# Patient Record
Sex: Male | Born: 1990 | Race: White | Hispanic: No | Marital: Single | Smoking: Never smoker
Health system: Southern US, Community
[De-identification: ages and names within clinical notes are randomized; demographics above are authoritative.]

## PROBLEM LIST (undated history)

## (undated) DIAGNOSIS — G809 Cerebral palsy, unspecified: Secondary | ICD-10-CM

## (undated) DIAGNOSIS — K219 Gastro-esophageal reflux disease without esophagitis: Secondary | ICD-10-CM

## (undated) DIAGNOSIS — R569 Unspecified convulsions: Secondary | ICD-10-CM

## (undated) DIAGNOSIS — H539 Unspecified visual disturbance: Secondary | ICD-10-CM

## (undated) DIAGNOSIS — F79 Unspecified intellectual disabilities: Secondary | ICD-10-CM

## (undated) HISTORY — PX: NISSEN FUNDOPLICATION: SHX2091

## (undated) HISTORY — DX: Unspecified convulsions: R56.9

## (undated) HISTORY — PX: HERNIA REPAIR: SHX51

## (undated) HISTORY — DX: Unspecified visual disturbance: H53.9

## (undated) HISTORY — PX: CIRCUMCISION: SUR203

---
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
## 2008-01-17 IMAGING — CT CT ABDOMEN W/O CM
1 of 2 series · 14 of 32 positions shown, 19 images · IV contrast (agent unspecified)
Comparison: None. 
CT ABDOMEN WITHOUT CONTRAST:

CLINICAL DATA: 16 year old assess peg tube abscess, drainage.  
CT ABDOMEN AND PELVIS WITHOUT CONTRAST:
TECHNIQUE: Multidetector CT imaging of the abdomen and pelvis was performed following the standard protocol without IV contrast.

[Series 2: abdomen w/ · axial · 0.70mm/px · z∈[-330,+75]mm · 14 of 93 slices shown, 19 images]
[im 6/93  soft-tissue]
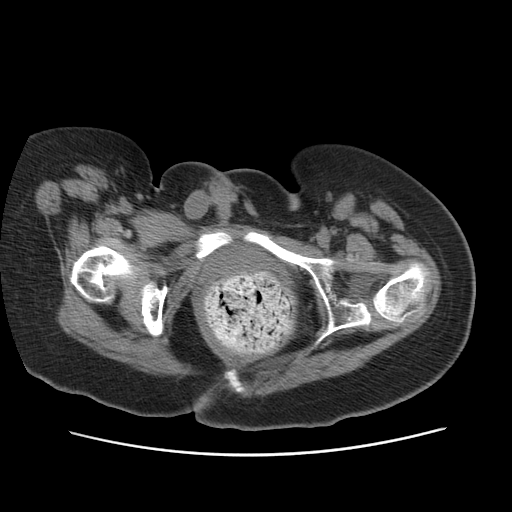
[im 6/93  bone]
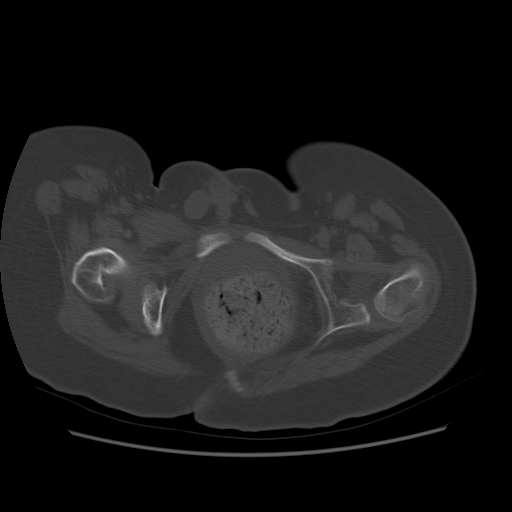
[im 11/93  soft-tissue]
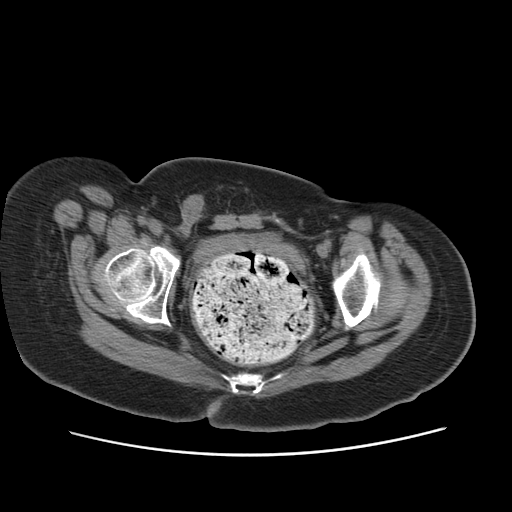
[im 22/93  soft-tissue]
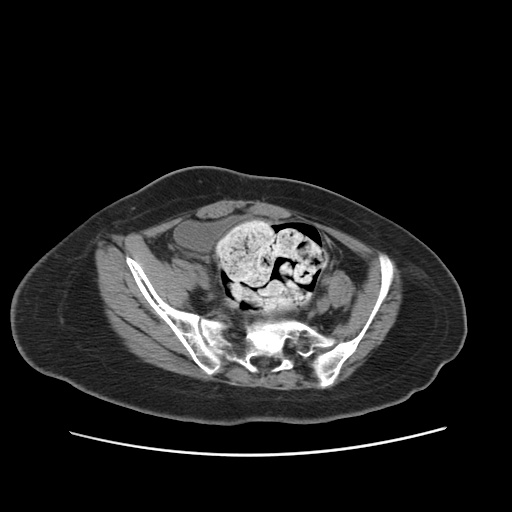
[im 28/93  soft-tissue]
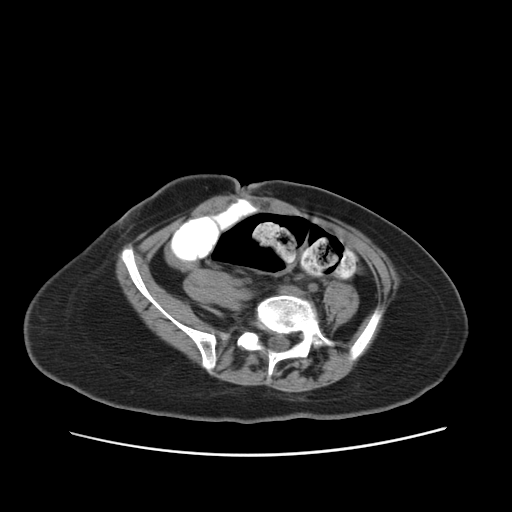
[im 33/93  soft-tissue]
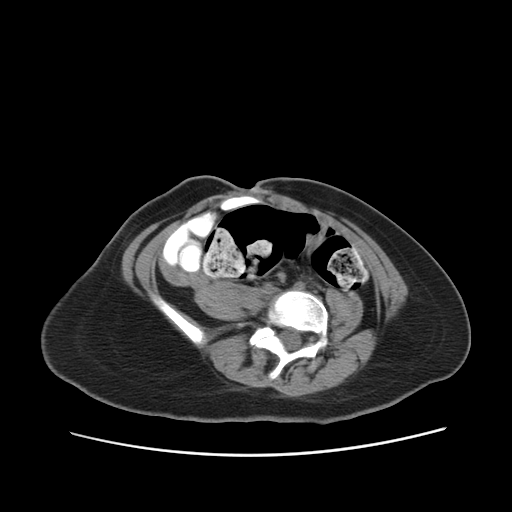
[im 38/93  soft-tissue]
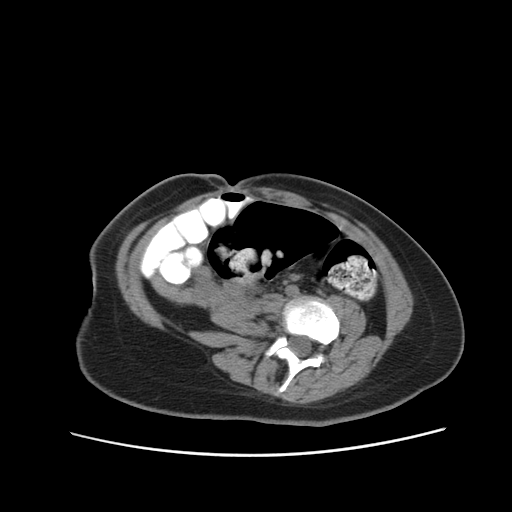
[im 49/93  soft-tissue]
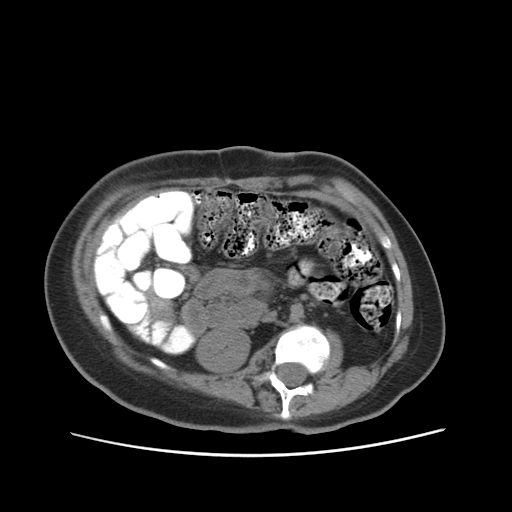
[im 55/93  soft-tissue]
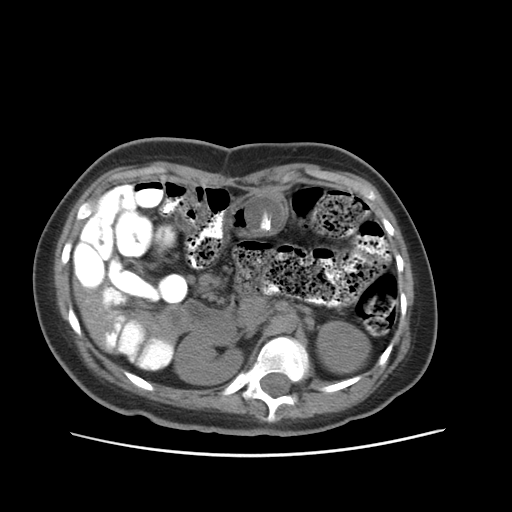
[im 60/93  soft-tissue]
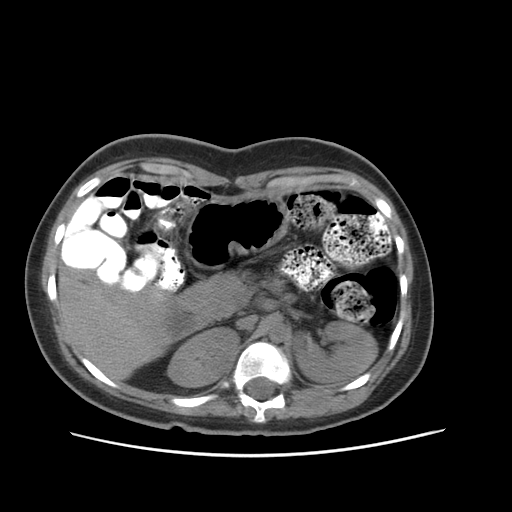
[im 60/93  bone]
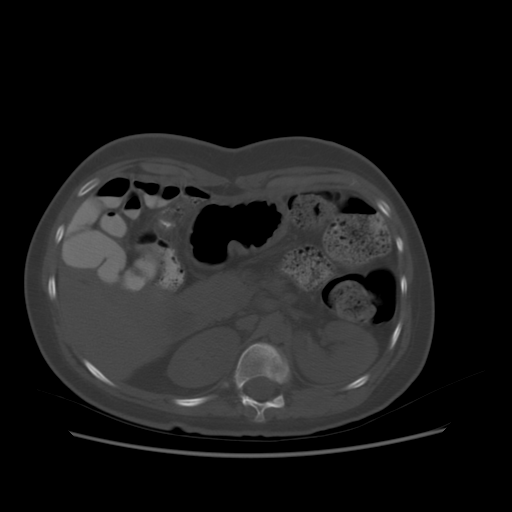
[im 65/93  soft-tissue]
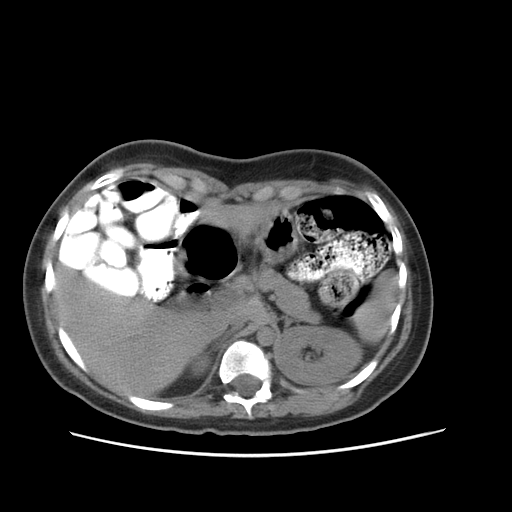
[im 71/93  soft-tissue]
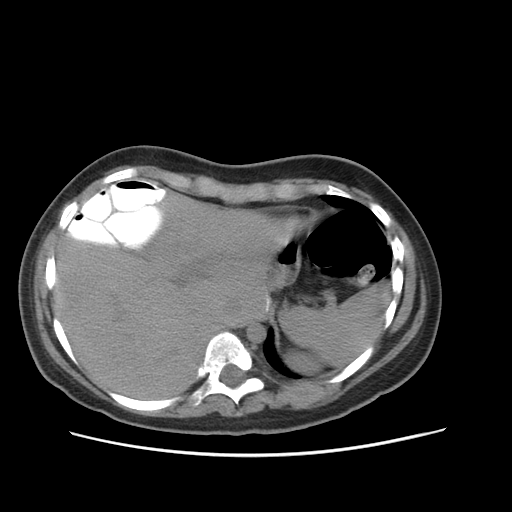
[im 71/93  lung]
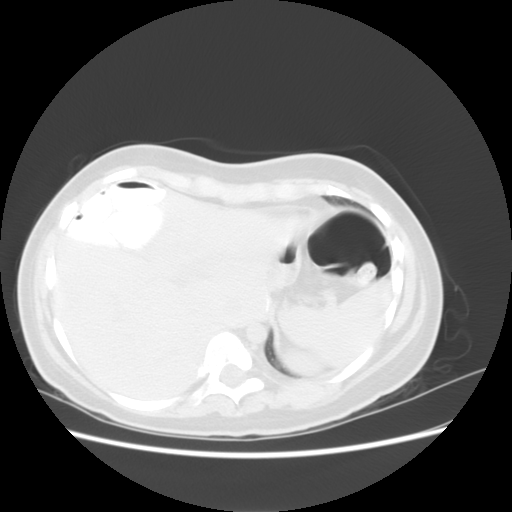
[im 76/93  lung]
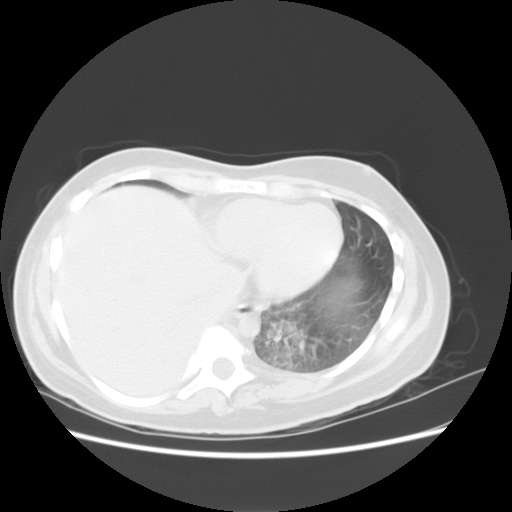
[im 82/93  soft-tissue]
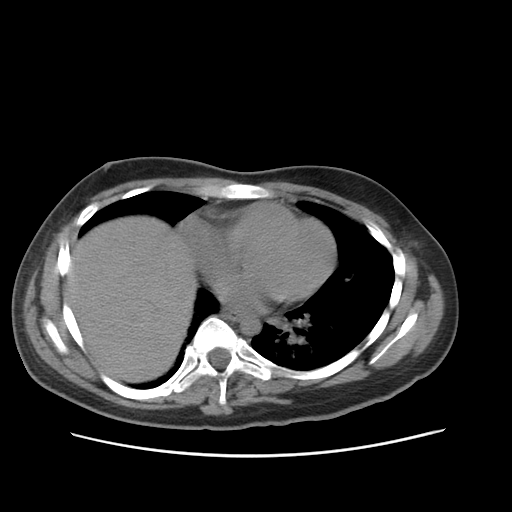
[im 82/93  lung]
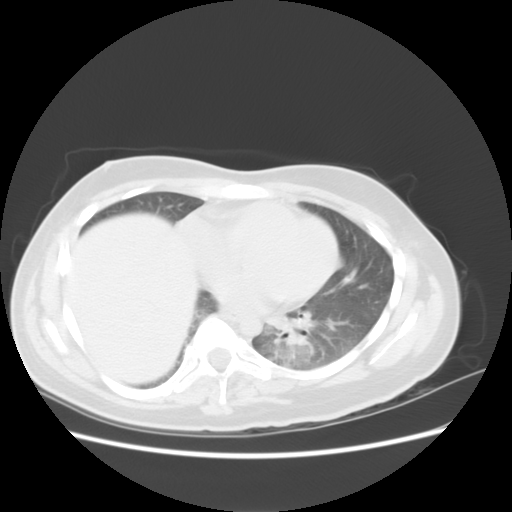
[im 87/93  soft-tissue]
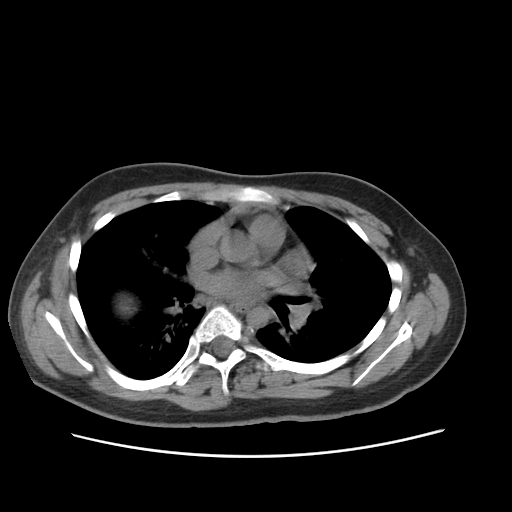
[im 87/93  lung]
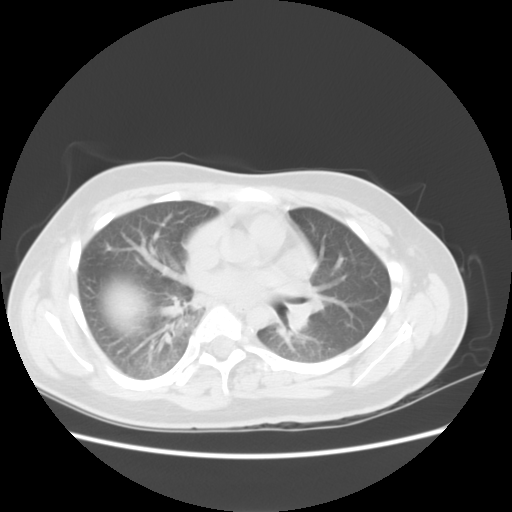

[14 of 32 positions shown; findings below may reference images not displayed]

FINDINGS: The lung bases demonstrate mild dependent atelectasis/edema.  
The unenhanced appearance of the liver and spleen are unremarkable.  Adrenal glands and kidneys demonstrate no significant findings.  The bowel is fairly well opacified and no significant abnormalities.  Moderate constipation is noted and the patient does have most of the small bowel on the right side and colon on the left side which is likely due to non rotation of the bowel.  
There is a peg tube in the stomach.  There is some slight soft tissue thickening particularly on the left along the left side of the tube.  It may be granulation tissue.  It may just be an under sized tube in a larger track.  I do not see any evidence for cellulitis in the surrounding subcutaneous fat.  No discrete fluid collection or abscess is seen.  The catheter is in the antral region of the stomach.  No free intra-abdominal air or free abdominal fluid collections.
IMPRESSION: 1.  Peg tube is in good position.  There is some mild soft tissue thickening along the left aspect of the track.  It may be granulation tissue or it may be an under sized tube in an over sized track.  I do not see any obvious inflammatory change, phlegmon, cellulitis, or abscess.  
2.  No acute abdominal findings. 
3.  Non rotated bowel with the colon on the left and small bowel on the right.  
CT PELVIS WITHOUT CONTRAST:
FINDINGS: Constipation and fecal impaction is noted.  No pelvic masses, adenopathy, or free pelvic fluid collections.  The patient has an undescended right testicle which is in the inguinal canal.  This needs follow up.
IMPRESSION: 1.  Constipation and fecal impaction in the rectum. 
2.  Undescended right testicle which is in the right inguinal canal.   This needs follow up.

## 2008-07-12 ENCOUNTER — Ambulatory Visit (HOSPITAL_COMMUNITY): Admission: RE | Admit: 2008-07-12 | Discharge: 2008-07-12 | Payer: Self-pay | Admitting: Gastroenterology

## 2009-05-21 ENCOUNTER — Ambulatory Visit (HOSPITAL_COMMUNITY): Admission: RE | Admit: 2009-05-21 | Discharge: 2009-05-21 | Payer: Self-pay | Admitting: Gastroenterology

## 2009-06-30 ENCOUNTER — Ambulatory Visit (HOSPITAL_COMMUNITY): Admission: RE | Admit: 2009-06-30 | Discharge: 2009-06-30 | Payer: Self-pay | Admitting: Gastroenterology

## 2010-03-31 ENCOUNTER — Ambulatory Visit (HOSPITAL_COMMUNITY): Admission: RE | Admit: 2010-03-31 | Discharge: 2010-03-31 | Payer: Self-pay | Admitting: Gastroenterology

## 2010-12-22 NOTE — Op Note (Signed)
NAME:  Wayne Meyers, Wayne Meyers NO.:  [id number]   MEDICAL RECORD NO.:  [id number]          PATIENT TYPE:  AMB   LOCATION:  ENDO                         [facility]   PHYSICIAN:  Shirley Friar, MDDATE OF BIRTH:  1991/07/16   DATE OF PROCEDURE:  DATE OF DISCHARGE:                               OPERATIVE REPORT   PROCEDURE PERFORMED:  PEG tube change.   INDICATIONS FOR PROCEDURE:  Feeding difficulties, recurrent difficulty  with the standard PEG tube that was placed to help due to tightness from  3.4 cm button PEG tube.   DESCRIPTION OF PROCEDURE:  Prior to starting the procedure it was noted  that Mr. Blaney had scattered erythematous patches on his skin, thought  to be due to dermatitis from _expelled_________  gastric contents from  current PEG tube.  The 20 cc balloon was deflated and the standard PEG  tube was removed without any difficulty.  A _Boston Scientific measuring  device was inserted and it was noted that a 4 cm button size PEG tube  would be adequate.  A 20 French x 4 cm button PEG tube was then inserted  and 15 cc of water was inserted into the balloon and the PEG tube was  placed in adequate fashion.   ASSESSMENT:  Status post 79 French x 4 cm button PEG tube insertion with  no immediate complications.   PLAN:  1. Continue tube feeds as previously recommended.  2. Due to skin erythema, give 10 days of cephalexin 250 mg/5 cc q.i.d.      through PEG tube.      Shirley Friar, MD  Electronically Signed     VCS/MEDQ  D:  07/05/2007  T:  07/05/2007  Job:  [id number]

## 2010-12-22 NOTE — Op Note (Signed)
NAME:  Wayne Meyers, Wayne Meyers NO.:  [id number]   MEDICAL RECORD NO.:  [id number]          PATIENT TYPE:  AMB   LOCATION:  ENDO                         FACILITY:  [facility]   PHYSICIAN:  Petra Kuba, M.D.    DATE OF BIRTH:  1991/06/03   DATE OF PROCEDURE:  07/12/2008  DATE OF DISCHARGE:                               OPERATIVE REPORT   PROCEDURE:  Button change.   INDICATION:  The patient with a leaking button, requesting change.  Consent was signed after risks, benefits, methods, and options  thoroughly discussed multiple times with the mother.   MEDICINES USED:  None.   PROCEDURE:  First we deflated the air and water from the current button.  After Betadine prepped the abdomen.  The abdomen did look well without  signs of infection.  Once the air and water were deflated, the button  was removed.  We had previously tested the new 20-French 4-cm button  with a 10 mL balloon __________ it is working with [employer] applied and  very easily went into the tract.  The balloon was inflated with 9-10 mL.  It then was checked and flushed well per the nurse.  The patient  tolerated the procedure well.  There was no obvious immediate  complication.   ENDOSCOPIC DIAGNOSIS:  Successful button change to a 20-French 4-cm  button.   PLAN:  Customary post button orders.  Continue present management.  Call  us p.r.n.           ______________________________  Petra Kuba, M.D.     MEM/MEDQ  D:  07/12/2008  T:  07/13/2008  Job:  [id number]   cc:   Elease Hashimoto A. Benedetto Goad, M.D.  Petra Kuba, M.D.

## 2010-12-22 NOTE — Op Note (Signed)
NAME:  Wayne Meyers, SALMINEN NO.:  [id number]   MEDICAL RECORD NO.:  [id number]          PATIENT TYPE:  EMS   LOCATION:  ED                           FACILITY:  [facility]   PHYSICIAN:  Bernette Redbird, M.D.   DATE OF BIRTH:  1991-07-02   DATE OF PROCEDURE:  06/25/2007  DATE OF DISCHARGE:                               OPERATIVE REPORT   PROCEDURE:  Gastrostomy tube exchange.   INDICATION:  This is an 20 year old with cerebral palsy who, for the  past 13 years or so, has had a peg button device but it was changed to a  gastrostomy tube about a month ago by Dr. Bosie Clos due to increasing  thickness of the abdominal wall and problems finding a button device  that had adequate length.   This evening, the patient pulled at his G tube, snapping off the adapter  plug portion.   FINDINGS:  Uneventful G tube exchange with the 20-French balloon type  tube.   PROCEDURE:  The procedure was familiar with the patient's mother who was  at the bedside.  The existing G tube was in place and the site appeared  intact.  The tube was freely mobile and consistent with intragastric  location although no gastric secretions were coming out.  The end of the  tube was without the adapter plug, which was in a separate piece that  the mother had along with her.  That tube did not plug into the end of  the G tube, but rather it snapped off and thus could not be reattached.  Accordingly, the existing tube, which was noted to be traction  removable, was readily removed by traction and the internal bolster was  delivered intact.  There was minor bleeding which stopped spontaneously  in a few moments.  A 20-French balloon type replacement tube was then  passing through tract after testing the balloon and lubricating the  tube.  The balloon was inflated.  There was spontaneous return of  gastric contents after insufflation of air that led to the typical  gurgling noise.  The site was dressed with a gauze  pad.  The patient  tolerated the procedure well and there no apparent complications.   IMPRESSION:  Uneventful gastrostomy tube exchange as described above.   PLAN:  Okay to resume tube feedings.           ______________________________  Bernette Redbird, M.D.     RB/MEDQ  D:  06/25/2007  T:  06/26/2007  Job:  [id number]   cc:   Shirley Friar, MD  Fax: [phone]

## 2010-12-22 NOTE — Op Note (Signed)
NAME:  Wayne Meyers, MINDER NO.:  [id number]   MEDICAL RECORD NO.:  [id number]          PATIENT TYPE:  AMB   LOCATION:  ENDO                         FACILITY:  [facility]   PHYSICIAN:  Shirley Friar, MDDATE OF BIRTH:  July 10, 1991   DATE OF PROCEDURE:  01/05/2007  DATE OF DISCHARGE:                               OPERATIVE REPORT   PROCEDURE:  Percutaneous endoscopic gastrostomy tube change.   SURGEON:  Shirley Friar, MD   INDICATIONS:  Feeding difficulties, gastrostomy tube dislodged.   History of cerebral palsy, seizure disorder, spastic quadriplegia.   PROCEDURE:  Temporary Foley catheter in gastrostomy site, balloon  deflated and removed.  EMLA cream was applied to the site 40-45 minutes  prior to procedure.  A 18-French x 3.4-cm Bard button PEG tube was  inserted into the gastrostomy site.  The balloon on the button PEG tube  was checked prior to insertion and then was then inflated to 10 mL with  sterile saline solution.  No immediate complications with the PEG tube  replacement.  A dressing was applied to the site.   IMPRESSION:  1. Placement of new Bard button gastrostomy tube, 18-French x 3.4 cm.  2. Removal of temporary Foley catheter in gastrostomy site.      Shirley Friar, MD  Electronically Signed     VCS/MEDQ  D:  01/05/2007  T:  01/05/2007  Job:  [id number]   cc:   Elease Hashimoto A. Benedetto Goad, M.D.

## 2010-12-22 NOTE — Op Note (Signed)
NAME:  JADRIEN, NARINE NO.:  [id number]   MEDICAL RECORD NO.:  [id number]          PATIENT TYPE:  EMS   LOCATION:  ED                           [facility]ACILITY:  [facility]   PHYSICIAN:  Bernette Redbird, M.D.   DATE OF BIRTH:  [birth year]   DATE OF PROCEDURE:  06/25/2007  DATE OF DISCHARGE:                               OPERATIVE REPORT   PLAN:  Resume tube feedings this evening.  Consider changing back to the  peg button device since the patient's mother prefers that and since he  has a tendency to pull at things that protrude from the skin.           ______________________________  Bernette Redbird, M.D.     RB/MEDQ  D:  06/25/2007  T:  06/26/2007  Job:  [id number]

## 2010-12-22 NOTE — Op Note (Signed)
NAME:  Wayne Meyers, Wayne Meyers               ACCOUNT NO.:  [id number]   MEDICAL RECORD NO.:  [id number]          PATIENT TYPE:  AMB   LOCATION:  [facility]                         FACILITY:  [facility]   PHYSICIAN:  Mark C. Vernie Ammons, M.D.  DATE OF BIRTH:  [birth year]   DATE OF PROCEDURE:  05/22/2007  DATE OF DISCHARGE:                               OPERATIVE REPORT   PREOPERATIVE DIAGNOSIS:  Right undescended testicle.   POSTOPERATIVE DIAGNOSIS:  Right undescended testicle.   PROCEDURE:  Right inguinal orchiectomy.   SURGEON:  Mark C. Vernie Ammons, M.D.   ANESTHESIA:  General.   SPECIMENS:  Right testicle to pathology.   BLOOD LOSS:  Minimal.   DRAINS:  None.   COMPLICATIONS:  None.   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  At that time Dr.  Levie Heritage I performed an orchiopexy and the patient underwent recent CT  scan which revealed the testicle was not in the scrotum but in the  inguinal canal and could not be palpated.  I, therefore, discussed with  his parents the need for exploration and orchiectomy as I did not think  I would be able to gain enough length to bring the testicle down into  the scrotum.  They understand and elected to proceed in that fashion.  The risks, complications and alternatives were discussed.   DESCRIPTION OF OPERATION:  After informed consent, the patient was  brought to the major OR, placed on the table, administered general  anesthesia.  His lower abdomen and genitalia was sterilely prepped and  draped.  An official time-out was then performed and latex precautions  were adhered to.  I used a transverse incision that had been previously  used and carried this down to the external oblique fascia which was  incised along its fibers.  I was able to identify the spermatic cord  including the vas and the vessels and used a Kitner to dissect this from  the surrounding structures.  A half-inch  Penrose drain was then placed  beneath this for use for traction and I then used blunt dissection to  free up the testicle which was located at the external ring region.  In  doing so I noted the testicle was mildly atrophic.  There was no  palpable.  There were no palpable nodules within the testicle noted at  the time that I freed the testicle up to palpation.  I also noted that  the cord structures were short and there was no chance of freeing up  enough cord to place the testicle in the scrotum so I proceeded with  orchiectomy as planned.   A hemostat was placed across the cord structures and the cord was  divided distally.  I then doubly ligated that with 2-0 silk suture,  first with a 2-0 silk tie and then a 2-0 silk suture ligature.  I then  irrigated the wound copiously with saline and reapproximated the  external oblique fascia with running 3-0 Vicryl suture.  I injected 0.5%  plain Marcaine in  the subcutaneous tissue and reapproximated the  subcutaneous tissue with several interrupted 3-0 chromic sutures.  The  skin was then closed with running 4-0 Monocryl and sterile occlusive  dressing was applied.  The patient was awakened and taken to recovery  room in stable satisfactory condition.  He tolerated procedure well.  There were no intraoperative complications.   He will be given a prescription for 24 Vicodin and Cipro 250 mg b.i.d.  for 2 days and will follow-up my office in 2 weeks or sooner if  necessary.      Mark C. Vernie Ammons, M.D.  Electronically Signed     MCO/MEDQ  D:  05/22/2007  T:  05/23/2007  Job:  [id number]

## 2010-12-22 NOTE — Op Note (Signed)
NAME:  Wayne Meyers, Wayne Meyers NO.:  [id number]   MEDICAL RECORD NO.:  [id number]          PATIENT TYPE:  AMB   LOCATION:  ENDO                         FACILITY:  [facility]   PHYSICIAN:  Petra Kuba, M.D.    DATE OF BIRTH:  [birth year]   DATE OF PROCEDURE:  12/19/2007  DATE OF DISCHARGE:                               OPERATIVE REPORT   PROCEDURE:  PEG change.   INDICATION:  The patient with cerebral palsy has a PEG replacement for  his button, requesting button replacement.  Consent was signed after  risks, benefits, methods, and options thoroughly discussed multiple  times in the past with multiple physicians with the mother.   MEDICINES USED:  None.   PROCEDURE:  The balloon was deflated in the PEG.  Approximately 18 mL of  water were withdrawn and the PEG was easily removed.  After testing the  20-French 4-cm [employer] scientific button replacement with gastrostomy  tube, the balloon was checked and deflated and using K-Y jelly, it was  easily placed into the ostomy.  Then the balloon was inflated with 10 mL  of water.  We then flushed with water in the customary fashion.  There  was no pain, complication, or problems with flushing.  The patient  tolerated the procedure well.   ASSESSMENT:  1. PEG removed with decreasing the balloon and very gentle pressure.  2. A 20-French 4-cm [employer] scientific balloon replacement done.   PLAN:  Okay to use.  Call us p.r.n.  Discussed above with the mother.  We showed her again how to use it including the balloon and supplies  were given.           ______________________________  Petra Kuba, M.D.     MEM/MEDQ  D:  12/19/2007  T:  12/20/2007  Job:  [id number]   cc:   Elease Hashimoto A. Benedetto Goad, M.D.

## 2010-12-22 NOTE — Op Note (Signed)
NAME:  TIMMEY, LAMBA NO.:  [id number]   MEDICAL RECORD NO.:  [id number]          PATIENT TYPE:  AMB   LOCATION:  ENDO                         FACILITY:  [facility]   PHYSICIAN:  Shirley Friar, MDDATE OF BIRTH:  11/12/1990   DATE OF PROCEDURE:  05/29/2007  DATE OF DISCHARGE:                               OPERATIVE REPORT   PROCEDURE:  PEG tube placement.   INDICATIONS:  Feeding difficulties, cerebral palsy, patient currently  has a 18-French button PEG tube and has some difficulty with reflux of  residuals through PEG tube, and decision was made to put a standard  adult PEG tube in place.   MEDICATIONS:  Fentanyl 25 mcg IV, Versed 5 mg IV.   FINDINGS:  The endoscope was inserted through the oropharynx, and the  esophagus was intubated.  The inflated balloon on the inside of the  stomach lumen was noted representing the current button PEG tube.  The  endoscope was advanced past this area into the duodenal bulb and second  portion of duodenum which were both normal.  The endoscope was  withdrawn, and retroflexion was done which revealed normal proximal  stomach.  The button PEG tube balloon was deflated and removed.  A  spring tip guidewire was inserted without resistance into the stomach  lumen.  Fluoroscopy was then used to confirm placement in the stomach  lumen.  An 18-French Savary dilator was inserted over the guidewire into  the stomach lumen.  A 21-French Savary dilator was then inserted over  the guidewire and the tip of the dilator was noted in the stomach lumen,  revealing successful dilation.  The spring tip guidewire and the 71-  [place] dilator were then removed.  The trocar for the PEG tube was  inserted through the track into the stomach lumen.  A 20-French PEG tube  was then attached and pulled through in the usual fashion.  The PEG tube  was secured at 4.5 cm at the skin.  The endoscope was reinserted into  the esophagus and passed down into  the stomach where some bright red  blood was noted in the stomach lumen.  Successful placement of the PEG  tube was noted without any active bleeding around the PEG tube site.  The endoscope was withdrawn back into the distal esophagus where active  bleeding was noted that spontaneously resolved at the GE junction  consistent with scope trauma and/or trauma during placement of the new  PEG tube.  Retroflexion was done which revealed no active bleeding.  The  endoscope withdrawn, confirming the above findings.   ASSESSMENT:  1. Status post successful 20-French percutaneous gastrostomy tube      placement.  2. Percutaneous gastrostomy tube track dilation with 18-French and 21-      French dilator using spring tip guidewire which was confirmed with      fluoroscopy placement.  3. Button percutaneous gastrostomy tube removed.   PLAN:  1. Continue bolus feeds through new PEG tube at the current dosing      scheduled.  2. Place binder in place except for dressing  changes for 1 week and as      needed to prevent accidental dislodgement of PEG tube.      Shirley Friar, MD  Electronically Signed     VCS/MEDQ  D:  05/29/2007  T:  05/30/2007  Job:  [id number]   cc:   Gilford Rile. Benedetto Goad, M.D.  Fax: [phone]

## 2010-12-22 NOTE — Op Note (Signed)
NAME:  Wayne Meyers, Wayne Meyers NO.:  [id number]   MEDICAL RECORD NO.:  [id number]          PATIENT TYPE:  AMB   LOCATION:  ENDO                         FACILITY:  [facility]   PHYSICIAN:  Bernette Redbird, M.D.   DATE OF BIRTH:  01-30-1991   DATE OF PROCEDURE:  DATE OF DISCHARGE:                               OPERATIVE REPORT   PROCEDURE:  Gastrostomy tube replacement.   INDICATIONS:  An 20 year old with cerebral palsy, who is many years'  status post PEG placement and has had various tube replacements in the  interim.  I worked with him back in 06/2008 and placed a 20-French  gastrostomy replacement tube; shortly thereafter, Dr. Doy Mince  replaced it with a 20-French x 4-cm balloon-type button device.  Last  night, the balloon popped and the tube came out.  They replaced it  overnight and confirmed appropriate positioning at [facility]  since they live in [place], and then called today and came to the [facility] for a replacement.   FINDINGS:  Uneventful replacement with 20-French balloon type  gastrostomy tube.   PROCEDURE:  The procedure was familiar to the patient's mother, Lavaughn Haberle, who provided written consent on his behalf.  They held tube  feedings this morning.  He arrived with the existing PEG button device  in appropriate location.  It slipped down the tract easily since the  balloon was popped.  The site appeared uninfected and without  granulation tissue.  A 20-French gastrostomy replacement tube was easily  passed through the tract, having first confirmed integrity of the  balloon by insufflating it with saline.  Once well inserted, the balloon  was reinflated, and the tube was withdrawn until I met resistance, which  was at about 5 or 5-1/2 cm on the external marking.  The external  bolster was slid down.  There was return of gastric contents into the  tube, so I did not do further checks such as insufflation or irrigation.  The site was  dressed with a gauze pad.  The patient tolerated the  procedure well and there were no apparent complications.   IMPRESSION:  Uneventful gastrostomy tube replacement as described above.   PLAN:  1. Okay to resume tube feedings and previous medications.  2. I have notified Dr. Bosie Clos by voice mail of the need to order a      PEG button device for this patient because they prefer to have that      in place.  He tends to pull things out.  Hopefully, it will arrive      in a few days and at that time, the patient can return for      replacement with a button.  In the meantime, the existing      gastrostomy tube should work fine.           ______________________________  Bernette Redbird, M.D.     RB/MEDQ  D:  12/16/2007  T:  12/16/2007  Job:  [id number]

## 2010-12-25 NOTE — Op Note (Signed)
[facility]  Patient:    Wayne Meyers, Wayne Meyers Visit Number: [id number] MRN: [id number]          Service Type: END Location: ENDO Attending Physician:  [place] Pho Damodar Dictated by:   Hyman Bible Pendse, M.D. Proc. Date: 09/28/01 Admit Date:  08/17/2001 Discharge Date: 08/17/2001                             Operative Report  PREOPERATIVE DIAGNOSIS: 1. Nonfunctioning gastrostomy button. 2. Patient with feeding difficulties, neurologic ____________ and cerebral    palsy.  POSTOPERATIVE DIAGNOSIS: 1. Nonfunctioning gastrostomy button. 2. Patient with feeding difficulties, neurologic ____________ and cerebral    palsy.  OPERATION PERFORMED: 1. Removal of old nonfunctioning gastrostomy button. 2. Placement of new Bard  #18 3.4 cm button.  SURGEON:  Prabhakar D. Levie Heritage, M.D.  ASSISTANT:  Nurse.  ANESTHESIA:  Topical EMLA cream.  DESCRIPTION OF PROCEDURE:  The gastrostomy button area was cleaned by means of a Kocher clamps.  Previously placed nonfunctioning gastrostomy button was grasped and removed with manipulation.  A new #18 Bard button with 3.4 cm stem was introduced with manipulation.  The button was irrigated with about 100 cc of saline.  There was no leakage noted.  The area was cleansed, appropriate dressing applied.  Instructions were given to the mother regarding the care of the button and the patient was discharged to be followed as an outpatient. Dictated by:   Hyman Bible Pendse, M.D. Attending Physician:  Carlos Levering DD:  09/28/01 TD:  09/28/01 Job: [id number]

## 2010-12-25 NOTE — Op Note (Signed)
[place]. [facility]  Patient:    ASAHEL, RISDEN Visit Number: [id number] MRN: [id number]          Service Type: END Location: ENDO Attending Physician:  Fayette Pho Damodar Dictated by:   Hyman Bible Pendse, M.D. Proc. Date: 05/26/01 Admit Date:  05/26/2001                             Operative Report  PREOPERATIVE DIAGNOSIS:  Nonfunctioning gastrostomy button.  POSTOPERATIVE DIAGNOSIS:  Nonfunctioning gastrostomy button.  PROCEDURE:  Removal of old nonfunctioning gastrostomy button and placement of new #18, 3.4 cm stemmed Bard button.  SURGEON:  Prabhakar D. Levie Heritage, M.D.  ASSISTANT:  Nurse.  ANESTHESIA:  Topical EMLA cream.  DESCRIPTION OF PROCEDURE:  Under satisfactory EMLA cream anesthesia, the gastrostomy site was cleansed.  The previously-placed nonfunctioning gastrostomy button was removed by manipulation.  A new #18 3.4 cm Bard button was placed with manipulation.  The button was placed without difficulty.  It was irrigated with 50 cc of saline.  No leakage was noted.  Appropriate instructions were given to the mother regarding the care of the button, and patient was discharged to be followed as an outpatient. Dictated by:   Hyman Bible Pendse, M.D. Attending Physician:  Carlos Levering DD:  05/26/08 TD:  05/27/01 Job: [id number]

## 2010-12-25 NOTE — Op Note (Signed)
   NAME:  Wayne Meyers, KAUER                         ACCOUNT NO.:  [id number]   MEDICAL RECORD NO.:  [id number]                   PATIENT TYPE:  AMB   LOCATION:  ENDO                                 FACILITY:  [facility]   PHYSICIAN:  Prabhakar D. Pendse, M.D.           DATE OF BIRTH:  Aug 15, 1990   DATE OF PROCEDURE:  10/16/2002  DATE OF DISCHARGE:                                 OPERATIVE REPORT   PREOPERATIVE DIAGNOSIS:  Non-functioning gastrostomy button.   POSTOPERATIVE DIAGNOSIS:  Non-functioning gastrostomy button.   OPERATION PERFORMED:  Removal of non-functioning gastrostomy button and  placement of new Bard #18, 3.4-cm stem button.   SURGEON:  Prabhakar D. Levie Heritage, M.D.   ASSISTANT:  Nurse.   ANESTHESIA:  Topical EMLA cream.   PROCEDURE:  Under satisfactory topical EMLA cream anesthesia, the  gastrostomy site was cleansed.  A previously placed, now non-functioning  gastrostomy button was removed with some manipulation.  The area was  cleansed.  Now, a new #18, 3.4-cm Bard button was placed with manipulation.  The button placement was checked to be sure it was in the proper place.  The  button was now irrigated with 50 mL of water.  There was no leakage noted.  No other abnormality was noted.  Hence, appropriate instructions were given  to the parental guardian on care of the button, and the patient was  discharged to be followed as an outpatient.                                               Prabhakar D. Levie Heritage, M.D.    PDP/MEDQ  D:  10/16/2002  T:  10/16/2002  Job:  [id number]

## 2010-12-25 NOTE — Op Note (Signed)
[place]. [facility]  Patient:    Wayne Meyers                       MRN: [id number] Proc. Date: 08/31/99 Adm. Date:  [id number] Attending:  Fayette Pho Damodar CC:         Dr. Konrad Felix                           Operative Report  PREOPERATIVE DIAGNOSIS: 1. Left undescended testicle, high. 2. Nonfunctioning gastrostomy button. 3. Past history of gastroschisis, bowel resection, cerebral palsy and seizure    disorder.  POSTOPERATIVE DIAGNOSIS: 1. Left undescended testicle, high. 2. Nonfunctioning gastrostomy button. 3. Past history of gastroschisis, bowel resection, cerebral palsy and seizure    disorder.  OPERATION PERFORMED: 1. Left orchiopexy. 2. Replacement of gastrostomy button, #18 Bard with 3.4 cm stem. SURGEON:  Prabhakar D. Levie Heritage, M.D.  ASSISTANT:  Nurse.  ANESTHESIA:  Nurse.  DESCRIPTION OF PROCEDURE:  Under satisfactory general endotracheal anesthesia, ith the patient in supine position, the abdomen and groin regions were thoroughly prepped and draped in the usual manner.  A 2.5 cm long transverse incision was ade in the left groin and distal skin crease.  The skin and subcutaneous tissues were incised.  Bleeders were individually clamped, cut and electrocoagulated. External oblique opened.  Exploration revealed testicle to be located at the internal ring. Testicle was mobilized and elevated from the inguinal canal.  Blunt and sharp dissection was carried out ____________ floor of the inguinal canal as well as he pelvic cavity in order to mobilize the spermatic cord.  Satisfactory mobilization of the spermatic cord was carried out until the testicle could be brought down o the left scrotal pouch with minimal tension.  At this time the hernia sac was separated from the spermatic cord structures up to its high point, doubly suture ligated with 4-0 silk and excess of the sac was excised.  The left inguinal tunnel as well  as left scrotum and subcutaneous pouch were created.  The testicle was brought through the left inguinal tunnel into the left scrotal subcutaneous pouch and was fixed to the scrotal fascia with 4-0 Vicryl interrupted sutures. Having placed the testicle in the left scrotal subcutaneous pouch, scrotal skin was closed with closed with 4-0 chromic interrupted sutures.  The inguinal canal area was irrigated.  Hemostasis accomplished.  Hernia repair was carried out by modified  Fergusons method with #35 wire interrupted sutures.  0.25% Marcaine with epinephrine was injected locally for postoperative analgesia.  Subcutaneous tissues apposed with 4-0 Vicryl.  Skin closed with 5-0 Monocryl subcuticular sutures. Steri-Strips applied.  Since the patients general condition was satisfactory, the gastrostomy site was  exposed.  The gastrostomy button was removed.  A new Bard #18, with 3.4 cm stem  button was introduced with manipulation.  The button was irrigated with about 100 cc of saline.  There was no resistance to the injection of the fluid, neither any other mechanical problems noted.  Hence the button was closed.  Appropriate  dressing applied.  Throughout the procedure, the patients vital signs remained stable.  The patient withstood the procedure well and was transferred to the recovery room in satisfactory general condition.DD:  08/31/99 TD:  08/31/99 Job: [id number]

## 2010-12-25 NOTE — Op Note (Signed)
NAME:  Wayne Meyers, Wayne Meyers               ACCOUNT NO.:  [id number]   MEDICAL RECORD NO.:  [id number]          PATIENT TYPE:  AMB   LOCATION:  ENDO                         FACILITY:  [facility]   PHYSICIAN:  Prabhakar D. Pendse, M.D.DATE OF BIRTH:  Jan 02, 1991   DATE OF PROCEDURE:  03/30/2005  DATE OF DISCHARGE:  03/30/2005                                 OPERATIVE REPORT   PREOPERATIVE DIAGNOSES:  1.  Broken gastrostomy button.  2.  Cerebral palsy with quadriparesis.   POSTOPERATIVE DIAGNOSES:  1.  Broken gastrostomy button.  2.  Cerebral palsy with quadriparesis.   OPERATION PERFORMED:  1.  Removal of old broken gastrostomy button.  2.  Placement of new Bard #18 French, 3.4 cm stem button.   SURGEON:  Prabhakar D. Levie Heritage, M.D.   ASSISTANT:  Nurse.   ANESTHESIA:  Topical EMLA cream and chloral hydrate sedation.   OPERATIVE PROCEDURE:  Under satisfactory topical EMLA cream anesthesia, the  patient in supine position, gastrostomy site was cleansed and draped.  Previously placed broken gastrostomy button was now removed by manipulation  and area was cleansed, lubricated with K-Y jelly and new #18 French Bard the  deep button with 3.4 cm stem was placed in with manipulation. The button  went in without any difficulty. The button was now irrigated with 50 mL of  saline. There was no leakage noted. Appropriate dressing applied.  Instructions were given to the parents regarding the care of the button and  patient was discharged to be followed as an outpatient.           ______________________________  Hyman Bible. Levie Heritage, M.D.     PDP/MEDQ  D:  06/07/2005  T:  06/07/2005  Job:  [id number]

## 2010-12-25 NOTE — Op Note (Signed)
NAME:  Wayne Meyers, Wayne Meyers                         ACCOUNT NO.:  [id number]   MEDICAL RECORD NO.:  [id number]                   PATIENT TYPE:  AMB   LOCATION:  ENDO                                 FACILITY:  [facility]   PHYSICIAN:  Leonia Corona, M.D.               DATE OF BIRTH:  08-01-1991   DATE OF PROCEDURE:  03/24/2004  DATE OF DISCHARGE:                                 OPERATIVE REPORT   PREOPERATIVE DIAGNOSES:  1. Damaged, leaking gastrostomy button.  2. 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   POSTOPERATIVE DIAGNOSES:  1. Damaged, leaking gastrostomy button.  2. 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   PROCEDURE PERFORMED:  Replacement of damaged and leaking gastrostomy button.   ANESTHESIA:  Topical EMLA cream.   SURGEON:  Dr. Leeanne Mannan.   ASSISTANT:  Nurse.   INDICATION FOR THE PROCEDURE:  This 20 year old male child who has a G-  button for long term nutritional care had accidentally broken the stem of  the button which started to leak stomach contents and difficult to feed  through.  Hence, the indication for the procedure.   PROCEDURE IN DETAIL:  The patient is brought into the endo suite and  __________ was already placed about a half hour before around the tube  gastrostomy button.  The stem of the button was held with a hemostat below  the tear and then a firm traction was applied to pull the gastrostomy button  out.  After removing the gastrostomy button the gastrotomy was dilated using  urethral dilator 12, 14, 16, 18 and finally up to 20 French dilatation was  done with well-lubricated dilators.  The 47 French Bard button with 3.4 cm  stem stretched over the introducer was firmly introduced through the  gastrotomy and inserted with quick pressure until the mushroom was in the  stomach.  The introducer was removed and catheter was checked for correct  placement.   Extension tube was connected and fluid irrigation was done using  saline.  The stomach irrigated without any difficulty, confirming correct  placement of the tube gastrostomy button.  Patient tolerated the procedure  very well which was smooth and uneventful.  Patient was later allowed to go  home, with the instruction to continue routine care and use of the  gastrostomy button.                                               Leonia Corona, M.D.    SF/MEDQ  D:  03/24/2004  T:  03/24/2004  Job:  [id number]

## 2010-12-25 NOTE — Op Note (Signed)
NAME:  Wayne Meyers, Wayne Meyers NO.:  [id number]   MEDICAL RECORD NO.:  [id number]         PATIENT TYPE:  EMS   LOCATION:  ED                           FACILITY:  [facility]   PHYSICIAN:  Prabhakar D. Pendse, M.D.DATE OF BIRTH:  1990-08-29   DATE OF PROCEDURE:  11/25/2004  DATE OF DISCHARGE:                                 OPERATIVE REPORT   PREOPERATIVE DIAGNOSES:  1.  Damaged gastrostomy button.  2.  Status post Nissen fundoplication and gastrostomy.  3.  History of cerebral palsy, quadriparesis and developmental delay.   POSTOPERATIVE DIAGNOSES:  1.  Damaged gastrostomy button.  2.  Status post Nissen fundoplication and gastrostomy.  3.  History of cerebral palsy, quadriparesis and developmental delay.   OPERATION PERFORMED:  1.  Removal of nonfunctioning gastrostomy button.  2.  Placement of new Bard #18, 3.4 cm stem button.   SURGEON:  Prabhakar D. Levie Heritage, M.D.   ASSISTANT:  Nurse.   ANESTHESIA:  Topical EMLA cream.   PROCEDURE:  Under satisfactory topical EMLA cream anesthesia, patient in  supine position, gastrostomy area was cleansed.  The previously-placed  nonfunctioning gastrostomy button was removed by manipulation.  The tract  was lubricated with K-Y jelly and the new #28 Bard button with 3.4 cm stem  was placed with manipulation.  No difficulties were encountered.  The button  was irrigated with 50 mL of saline.  There was no leakage noted.  Appropriate dressing applied.  Instructions were given to the parents  regarding the care of the button, and the patient was discharged to be  followed as an outpatient.       ___________________________________________  Hyman Bible. Levie Heritage, M.D.    PDP/MEDQ  D:  11/25/2004  T:  11/25/2004  Job:  [id number]

## 2010-12-25 NOTE — Op Note (Signed)
[place]. [facility]  Patient:    JHASE, CREPPEL Visit Number: [id number] MRN: [id number]          Service Type: END Location: ENDO Attending Physician:  Fayette Pho Damodar Dictated by:   Hyman Bible Pendse, M.D. Proc. Date: 08/17/01 Admit Date:  08/17/2001                             Operative Report  PREOPERATIVE DIAGNOSIS:  Malfunctioning gastrostomy button.  POSTOPERATIVE DIAGNOSIS:  Malfunctioning gastrostomy button.  PROCEDURES: 1. Removal of old gastrostomy tube. 2. Placement of new Bard #18, 3.4 cm button.  SURGEON:  Prabhakar D. Levie Heritage, M.D.  ASSISTANT:  Nurse.  ANESTHESIA:  Nurse, topical EMLA cream.  DESCRIPTION OF PROCEDURE:  Under satisfactory topical EMLA cream anesthesia, previously-placed gastrostomy tube was removed.  New #18 Bard button with 3.4 cm stem was placed in by manipulation.  Button was irrigated with 50 cc of saline.  There was no leakage noted.  Appropriate instructions were given to the parent regarding the care of the button, and the patient was discharged to be followed as an outpatient. Dictated by:   Hyman Bible Pendse, M.D. Attending Physician:  Carlos Levering DD:  08/17/01 TD:  08/17/01 Job: [id number]

## 2010-12-25 NOTE — Op Note (Signed)
NAME:  GEDDY, BOYDSTUN               ACCOUNT NO.:  [id number]   MEDICAL RECORD NO.:  [id number]          PATIENT TYPE:  AMB   LOCATION:  ENDO                         FACILITY:  [facility]   PHYSICIAN:  Prabhakar D. Pendse, M.D.DATE OF BIRTH:  31-Jul-1991   DATE OF PROCEDURE:  03/30/2005  DATE OF DISCHARGE:  03/30/2005                                 OPERATIVE REPORT   PREOPERATIVE DIAGNOSES:  1.  Broken gastrostomy button.  2.  Cerebral palsy and quadriparesis.   POSTOPERATIVE DIAGNOSES:  1.  Broken gastrostomy button.  2.  Cerebral palsy and quadriparesis.   OPERATION PERFORMED:  1.  Removal of broken gastrostomy button.  2.  Placement of new Bard #18-French, 3.4 cm button.   SURGEON:  Prabhakar D. Levie Heritage, M.D.   ASSISTANT:  Nurse.   ANESTHESIA:  Topical Emla cream and chloral hydrate sedation.   PROCEDURE:  Under satisfactory topical anesthesia, the gastrostomy site was  prepped.  By manipulation, the old broken gastrostomy button was removed.  The area was lubricated and new #18-French 3.4 cm stem Bard button was  placed by manipulation.  The button was now irrigated with about 50 mL of  saline.  The fluid flowed in without any obstruction and there was no  leakage noted. The area was cleansed and dressed.  Appropriate instructions  were given to the parent.  Regarding the care of the button and the patient  was discharged to be followed.           ______________________________  Hyman Bible Levie Heritage, M.D.     PDP/MEDQ  D:  06/07/2005  T:  06/07/2005  Job:  [id number]

## 2010-12-25 NOTE — Op Note (Signed)
NAME:  Wayne Meyers, Wayne Meyers NO.:  [id number]   MEDICAL RECORD NO.:  [id number]          PATIENT TYPE:  AMB   LOCATION:  ENDO                         FACILITY:  [facility]   PHYSICIAN:  Prabhakar D. Pendse, M.D.DATE OF BIRTH:  11-17-1990   DATE OF PROCEDURE:  02/14/2006  DATE OF DISCHARGE:                                 OPERATIVE REPORT   PREOPERATIVE DIAGNOSES:  1.  Nonfunctioning gastrostomy button.  2.  History of cerebral palsy, seizure disorder, spastic quadriplegia.   POSTOPERATIVE DIAGNOSES:  1.  Nonfunctioning gastrostomy button.  2.  History of cerebral palsy, seizure disorder, spastic quadriplegia.   OPERATIONS PERFORMED:  1.  Removal of old nonfunctioning gastrostomy button.  2.  Placement of new Bard button, 18-French, 3.4 cm.   SURGEON:  Prabhakar D. Levie Heritage, M.D.   ASSISTANT:  Nurse.   PROCEDURE:  Under topical anesthesia, the patient in supine position,  gastrostomy site was prepped and draped.  Previously placed nonfunctioning  gastrostomy button was removed.  The tract was lubricated with KY-Jelly.  New #18-French, 3.4 cm stem, Bard button place with manipulation.  There  were no complications noted.  Button was irrigated with about 50 mL of  saline.  No leakage was noted.  Appropriate instructions were given to the  parents regarding the care of the button.  The patient was discharged to be  followed as an outpatient.           ______________________________  Hyman Bible. Levie Heritage, M.D.     PDP/MEDQ  D:  02/14/2006  T:  02/14/2006  Job:  [id number]

## 2011-01-21 ENCOUNTER — Ambulatory Visit (HOSPITAL_COMMUNITY)
Admission: RE | Admit: 2011-01-21 | Discharge: 2011-01-21 | Disposition: A | Discharge status: Home / Self Care | Payer: Medicaid Other | Source: Ambulatory Visit | Attending: Gastroenterology | Admitting: Gastroenterology

## 2011-01-21 DIAGNOSIS — K9423 Gastrostomy malfunction: Secondary | ICD-10-CM | POA: Insufficient documentation

## 2011-01-21 DIAGNOSIS — Y833 Surgical operation with formation of external stoma as the cause of abnormal reaction of the patient, or of later complication, without mention of misadventure at the time of the procedure: Secondary | ICD-10-CM | POA: Insufficient documentation

## 2011-01-21 DIAGNOSIS — F79 Unspecified intellectual disabilities: Secondary | ICD-10-CM | POA: Insufficient documentation

## 2011-01-21 DIAGNOSIS — G809 Cerebral palsy, unspecified: Secondary | ICD-10-CM | POA: Insufficient documentation

## 2011-03-03 NOTE — Op Note (Signed)
  NAME:  Wayne Meyers, RAISANEN NO.:  [id number]  MEDICAL RECORD NO.:  [id number]  LOCATION:  [facility]                         FACILITY:  [facility]  PHYSICIAN:  Graylin Shiver, M.D.   DATE OF BIRTH:  1990-09-16  DATE OF PROCEDURE:  01/21/2011 DATE OF DISCHARGE:                              OPERATIVE REPORT   INDICATION:  PEG malfunctioning.  The patient came into the [facility] endoscopy unit as an outpatient.  His PEG was removed that was in, and it was replaced without difficulty using a 20-French 4.4-cm low-profile replacement PEG.  He did well and was sent home.          ______________________________ Graylin Shiver, M.D.     SFG/MEDQ  D:  01/21/2011  T:  01/22/2011  Job:  [id number]  cc:   Dr. Bosie Clos  Electronically Signed by Herbert Moors MD on 03/03/2011 03:42:06 PM

## 2011-05-20 LAB — POCT HEMOGLOBIN-HEMACUE
Hemoglobin: 14
Operator id: 268271

## 2011-12-06 ENCOUNTER — Encounter (HOSPITAL_COMMUNITY): Payer: Self-pay | Admitting: Anesthesiology

## 2011-12-07 ENCOUNTER — Encounter (HOSPITAL_COMMUNITY): Payer: Self-pay | Admitting: *Deleted

## 2011-12-07 ENCOUNTER — Encounter (HOSPITAL_COMMUNITY)
Admission: RE | Disposition: A | Discharge status: Home Health | Payer: Self-pay | Source: Ambulatory Visit | Attending: Gastroenterology

## 2011-12-07 ENCOUNTER — Ambulatory Visit (HOSPITAL_COMMUNITY)
Admission: RE | Admit: 2011-12-07 | Discharge: 2011-12-07 | Disposition: A | Discharge status: Home Health | Payer: Medicaid Other | Source: Ambulatory Visit | Attending: Gastroenterology | Admitting: Gastroenterology

## 2011-12-07 DIAGNOSIS — Z431 Encounter for attention to gastrostomy: Secondary | ICD-10-CM | POA: Insufficient documentation

## 2011-12-07 HISTORY — DX: Unspecified intellectual disabilities: F79

## 2011-12-07 HISTORY — DX: Gastro-esophageal reflux disease without esophagitis: K21.9

## 2011-12-07 HISTORY — DX: Cerebral palsy, unspecified: G80.9

## 2011-12-07 HISTORY — DX: Unspecified convulsions: R56.9

## 2011-12-07 HISTORY — PX: PEG PLACEMENT: SHX5437

## 2011-12-07 SURGERY — REPLACEMENT, PEG TUBE, WITHOUT ENDOSCOPY
Anesthesia: Choice

## 2011-12-07 NOTE — Op Note (Signed)
The patient presents to the endoscopy unit for a PEG button change. His PEG button was malfunctioning.  The 20 French PEG button that was in place was easily removed after the balloon was deflated and then a new 20 [place] button PEG was placed without difficulty.  He was sent home with family

## 2011-12-08 ENCOUNTER — Encounter (HOSPITAL_COMMUNITY): Payer: Self-pay | Admitting: Gastroenterology

## 2013-01-01 ENCOUNTER — Encounter (HOSPITAL_COMMUNITY): Payer: Self-pay | Admitting: Emergency Medicine

## 2013-01-01 ENCOUNTER — Emergency Department (HOSPITAL_COMMUNITY)
Admission: EM | Admit: 2013-01-01 | Discharge: 2013-01-01 | Disposition: A | Discharge status: Home / Self Care | Payer: Medicaid Other | Attending: Emergency Medicine | Admitting: Emergency Medicine

## 2013-01-01 DIAGNOSIS — G809 Cerebral palsy, unspecified: Secondary | ICD-10-CM | POA: Insufficient documentation

## 2013-01-01 DIAGNOSIS — K9423 Gastrostomy malfunction: Secondary | ICD-10-CM

## 2013-01-01 DIAGNOSIS — Z79899 Other long term (current) drug therapy: Secondary | ICD-10-CM | POA: Insufficient documentation

## 2013-01-01 DIAGNOSIS — G40909 Epilepsy, unspecified, not intractable, without status epilepticus: Secondary | ICD-10-CM | POA: Insufficient documentation

## 2013-01-01 DIAGNOSIS — Z8719 Personal history of other diseases of the digestive system: Secondary | ICD-10-CM | POA: Insufficient documentation

## 2013-01-01 DIAGNOSIS — F79 Unspecified intellectual disabilities: Secondary | ICD-10-CM | POA: Insufficient documentation

## 2013-01-01 DIAGNOSIS — Y833 Surgical operation with formation of external stoma as the cause of abnormal reaction of the patient, or of later complication, without mention of misadventure at the time of the procedure: Secondary | ICD-10-CM | POA: Insufficient documentation

## 2013-01-01 NOTE — ED Notes (Signed)
Pt is nonverbal.  States that at about 3 pm, the feeding tube "button" broke off in the feeding tube site.  NAD.

## 2013-01-01 NOTE — ED Provider Notes (Signed)
History     CSN: [id number]  Arrival date & time 01/01/13  1657   First MD Initiated Contact with Patient 01/01/13 1718      Chief Complaint  Patient presents with  . Feeding Tube Problem     (Consider location/radiation/quality/duration/timing/severity/associated sxs/prior treatment) HPI Comments: 22 y/o male with a PMHx of mental retardation, cerebral palsy, and seizures brought into the ED by his mother needing a replacement of his feeding tube. Around 3:00 pm today the part of the tube that opens and closes it snapped and can no longer be opened. Denies any other issues at this time.  The history is provided by a parent.    Past Medical History  Diagnosis Date  . Mental retardation   . Cerebral palsy   . Esophageal reflux   . Seizure     Past Surgical History  Procedure Laterality Date  . Peg placement  12/07/2011    Procedure: PERCUTANEOUS ENDOSCOPIC GASTROSTOMY (PEG) REPLACEMENT;  Surgeon: Graylin Shiver, MD;  Location: [facility] ENDOSCOPY;  Service: Endoscopy;  Laterality: N/A;  20 french 4.4 cent.    History reviewed. No pertinent family history.  History  Substance Use Topics  . Smoking status: Not on file  . Smokeless tobacco: Not on file  . Alcohol Use:       Review of Systems  Constitutional:       Need feeding tube changed.  All other systems reviewed and are negative.    Allergies  Review of patient's allergies indicates not on file.  Home Medications   Current Outpatient Rx  Name  Route  Sig  Dispense  Refill  . carBAMazepine (TEGRETOL) 100 MG/5ML suspension   Oral   Take 300 mg by mouth 3 (three) times daily.         Marland Kitchen glycopyrrolate (ROBINUL) 1 MG tablet   Oral   Take 1 mg by mouth 2 (two) times daily.         . primidone (MYSOLINE) 250 MG tablet   Oral   Take 250 mg by mouth 3 (three) times daily.         . Valproic Acid (DEPAKENE) 250 MG/5ML SYRP syrup   Oral   Take 250 mg by mouth 3 (three) times daily.           BP  107/76  Pulse 83  Temp(Src) 97.9 F (36.6 C) (Axillary)  Resp 16  SpO2 99%  Physical Exam  Nursing note and vitals reviewed. Constitutional: He appears well-nourished. No distress.  Sleeping comfortably on exam bed.  HENT:  Head: Normocephalic and atraumatic.  Mouth/Throat: Oropharynx is clear and moist.  Eyes: Conjunctivae are normal.  Neck: Normal range of motion. Neck supple.  Cardiovascular: Normal rate, regular rhythm and normal heart sounds.   Pulmonary/Chest: Effort normal and breath sounds normal.  Abdominal: Normal appearance and bowel sounds are normal. There is no tenderness.    Skin: Skin is warm and dry. No erythema.    ED Course  Procedures (including critical care time)  Labs Reviewed - No data to display No results found.   1. PEG tube malfunction       MDM  PEG tube 24 F, however no piece to deflate balloon. Tube still functions. I spoke with Dr. Randa Evens, GI on call who states as long as tube functions, it can be tied off and replaced in the endoscopy unit tomorrow. I discussed this with mom who states understanding and will call Dr. Bosie Clos (patient's GI) in  the morning. Dr. Randa Evens will also have office give patient a call. Case discussed with Dr. Freida Busman who also evaluated patient and agrees with plan of care.        Trevor Mace, PA-C 01/01/13 1827

## 2013-01-01 NOTE — ED Provider Notes (Signed)
Medical screening examination/treatment/procedure(s) were conducted as a shared visit with non-physician practitioner(s) and myself.  I personally evaluated the patient during the encounter  Patient seen and examined and will need to have his feeding tube replaced by gastroenterology  Toy Baker, MD 01/01/13 [phone]

## 2013-01-03 NOTE — ED Provider Notes (Signed)
Medical screening examination/treatment/procedure(s) were performed by non-physician practitioner and as supervising physician I was immediately available for consultation/collaboration.  Marguis Mathieson T Khiya Friese, MD 01/03/13 2326 

## 2013-01-04 ENCOUNTER — Encounter (HOSPITAL_COMMUNITY)
Admission: RE | Disposition: A | Discharge status: Home / Self Care | Payer: Self-pay | Source: Ambulatory Visit | Attending: Gastroenterology

## 2013-01-04 ENCOUNTER — Ambulatory Visit (HOSPITAL_COMMUNITY)
Admission: RE | Admit: 2013-01-04 | Discharge: 2013-01-04 | Disposition: A | Discharge status: Home / Self Care | Payer: Medicaid Other | Source: Ambulatory Visit | Attending: Gastroenterology | Admitting: Gastroenterology

## 2013-01-04 ENCOUNTER — Encounter (HOSPITAL_COMMUNITY): Payer: Self-pay

## 2013-01-04 DIAGNOSIS — Z880 Allergy status to penicillin: Secondary | ICD-10-CM | POA: Insufficient documentation

## 2013-01-04 DIAGNOSIS — G809 Cerebral palsy, unspecified: Secondary | ICD-10-CM | POA: Insufficient documentation

## 2013-01-04 DIAGNOSIS — K219 Gastro-esophageal reflux disease without esophagitis: Secondary | ICD-10-CM | POA: Insufficient documentation

## 2013-01-04 DIAGNOSIS — G40909 Epilepsy, unspecified, not intractable, without status epilepticus: Secondary | ICD-10-CM | POA: Insufficient documentation

## 2013-01-04 DIAGNOSIS — Z431 Encounter for attention to gastrostomy: Secondary | ICD-10-CM | POA: Insufficient documentation

## 2013-01-04 DIAGNOSIS — Z9109 Other allergy status, other than to drugs and biological substances: Secondary | ICD-10-CM | POA: Insufficient documentation

## 2013-01-04 DIAGNOSIS — F79 Unspecified intellectual disabilities: Secondary | ICD-10-CM | POA: Insufficient documentation

## 2013-01-04 HISTORY — PX: PEG PLACEMENT: SHX5437

## 2013-01-04 SURGERY — REPLACEMENT, PEG TUBE, WITHOUT ENDOSCOPY

## 2013-01-04 NOTE — H&P (Signed)
   [facility] Admission History & Physical  Chief Complaint: Needs PEG tube replaced HPI: Wayne Meyers is an 22 y.o. white male.  With severe cerebral palsy dependent on PEG feedings who has a followup the tube it needs to be replaced as above Past Medical History  Diagnosis Date  . Mental retardation   . Cerebral palsy   . Esophageal reflux   . Seizure     Past Surgical History  Procedure Laterality Date  . Peg placement  12/07/2011    Procedure: PERCUTANEOUS ENDOSCOPIC GASTROSTOMY (PEG) REPLACEMENT;  Surgeon: Graylin Shiver, MD;  Location: [facility];  Service: Endoscopy;  Laterality: N/A;  20 french 4.4 cent.  Ellyn Hack fundoplication  1986    Medications Prior to Admission  Medication Sig Dispense Refill  . carBAMazepine (TEGRETOL) 100 MG/5ML suspension Take 300 mg by mouth 3 (three) times daily.      Marland Kitchen glycopyrrolate (ROBINUL) 1 MG tablet Take 1 mg by mouth 2 (two) times daily.      . primidone (MYSOLINE) 250 MG tablet Take 250 mg by mouth 3 (three) times daily.      . Valproic Acid (DEPAKENE) 250 MG/5ML SYRP syrup Take 250 mg by mouth 3 (three) times daily.        Allergies:  Allergies  Allergen Reactions  . Augmentin (Amoxicillin-Pot Clavulanate) Diarrhea  . Powders (Talc) Rash    From powdered gloves    History reviewed. No pertinent family history.  Social History:  reports that he does not drink alcohol or use illicit drugs. His tobacco history is not on file.  Review of Systems: negative except as above   Blood pressure 100/63, pulse 61, temperature 98.4 F (36.9 C), temperature source Oral, resp. rate 17, SpO2 98.00%. Head: Normocephalic, without obvious abnormality, atraumatic Neck: no adenopathy, no carotid bruit, no JVD, supple, symmetrical, trachea midline and thyroid not enlarged, symmetric, no tenderness/mass/nodules Resp: clear to auscultation bilaterally Cardio: regular rate and rhythm, S1, S2 normal, no murmur, click, rub or  gallop GI: Abdomen soft. PEG tube intact Extremities: extremities normal, atraumatic, no cyanosis or edema  No results found for this or any previous visit (from the past 48 hour(s)). No results found.  Assessment: Difficulty with PEG tube Plan: A 20 French 4.4 cm mini-one Low Profile balloon button was placed after the original one was removed by traction. 10 cc of water was used to fill the balloon and adequate aspiration and lavage was accomplished with new tube. It was dressed with sterile 4 x 4's and antibiotic ointment.  Wayne Meyers C 01/04/2013, 10:35 AM

## 2013-04-02 ENCOUNTER — Other Ambulatory Visit: Payer: Self-pay | Admitting: Family

## 2013-04-02 DIAGNOSIS — R1312 Dysphagia, oropharyngeal phase: Secondary | ICD-10-CM

## 2013-04-02 DIAGNOSIS — G40309 Generalized idiopathic epilepsy and epileptic syndromes, not intractable, without status epilepticus: Secondary | ICD-10-CM

## 2013-04-02 DIAGNOSIS — G808 Other cerebral palsy: Secondary | ICD-10-CM

## 2013-04-11 DIAGNOSIS — M4150 Other secondary scoliosis, site unspecified: Secondary | ICD-10-CM

## 2013-04-11 DIAGNOSIS — R1310 Dysphagia, unspecified: Secondary | ICD-10-CM

## 2013-04-11 DIAGNOSIS — G40309 Generalized idiopathic epilepsy and epileptic syndromes, not intractable, without status epilepticus: Secondary | ICD-10-CM | POA: Insufficient documentation

## 2013-04-11 DIAGNOSIS — G253 Myoclonus: Secondary | ICD-10-CM | POA: Insufficient documentation

## 2013-04-11 DIAGNOSIS — H47619 Cortical blindness, unspecified side of brain: Secondary | ICD-10-CM | POA: Insufficient documentation

## 2013-04-11 DIAGNOSIS — G808 Other cerebral palsy: Secondary | ICD-10-CM | POA: Insufficient documentation

## 2013-04-11 DIAGNOSIS — R1312 Dysphagia, oropharyngeal phase: Secondary | ICD-10-CM | POA: Insufficient documentation

## 2013-04-11 DIAGNOSIS — M4147 Neuromuscular scoliosis, lumbosacral region: Secondary | ICD-10-CM | POA: Insufficient documentation

## 2013-05-02 ENCOUNTER — Telehealth: Payer: Self-pay | Admitting: Family

## 2013-05-03 ENCOUNTER — Ambulatory Visit (INDEPENDENT_AMBULATORY_CARE_PROVIDER_SITE_OTHER): Payer: Medicaid Other | Admitting: Pediatrics

## 2013-05-03 ENCOUNTER — Encounter: Payer: Self-pay | Admitting: Pediatrics

## 2013-05-03 VITALS — BP 90/60 | HR 88 | Wt 120.0 lb

## 2013-05-03 DIAGNOSIS — R1312 Dysphagia, oropharyngeal phase: Secondary | ICD-10-CM

## 2013-05-03 DIAGNOSIS — G40309 Generalized idiopathic epilepsy and epileptic syndromes, not intractable, without status epilepticus: Secondary | ICD-10-CM

## 2013-05-03 DIAGNOSIS — G808 Other cerebral palsy: Secondary | ICD-10-CM

## 2013-05-03 DIAGNOSIS — H47619 Cortical blindness, unspecified side of brain: Secondary | ICD-10-CM

## 2013-05-03 DIAGNOSIS — M4150 Other secondary scoliosis, site unspecified: Secondary | ICD-10-CM

## 2013-05-03 MED ORDER — VALPROIC ACID 250 MG/5ML PO SYRP: ORAL_SOLUTION | ORAL | Status: DC

## 2013-05-03 MED ORDER — PRIMIDONE 250 MG PO TABS: ORAL_TABLET | ORAL | Status: DC

## 2013-05-03 MED ORDER — CARBAMAZEPINE 100 MG/5ML PO SUSP: ORAL | Status: DC

## 2013-05-03 MED ORDER — GLYCOPYRROLATE 1 MG PO TABS: ORAL_TABLET | ORAL | Status: DC

## 2013-05-03 NOTE — Progress Notes (Signed)
Patient: Wayne Meyers MRN: [id number] Sex: male DOB: [birth year]  Provider: Deetta Perla, MD Location of Care: [facility]  Note type: Routine return visit  History of Present Illness: Referral Source: Dr. Irena Reichmann History from: mother and [facility] worker. and [facility] chart Chief Complaint: Seizure/Cortical Blindness  Wayne Meyers is a 22 y.o. male who returns for ongoing evaluation and management of seizures, spastic motor paresis, cognitive impairment, cortical blindness.  The patient returns May 03, 2013, for the first time since April 21, 2012.  I followed this young man since infancy.  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  Seizures were initially difficult to control.  He is totally dependent on his mother for care.  He is here today for his annual checkup.  I have not felt comfortable tapering or discontinuing his antiepileptic medications because it was so difficult to control his seizures, and the medicines do not have significant side effects for him.  His daily routine is to get up around 7.  He takes his medications and often takes a 45-minute to one hour nap after he takes his medications.  There are times that he can sleep all day and other times when he does not sleep at all.  He typically goes to bed between 9 and 10 and will sleep all night.    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  Right now there is just one worker, and so his parents have not made use of the respite care.  He is largely fed by gastrostomy because of his dysphagia.  His mother will give him some yogurt for constipation.  He  usually tolerates this well, but when his breath becomes labored or his breathing sounds a raspy, she will stop.  He has been physically stable since his last visit.  No new concerns were raised today.  Review of Systems: 12 system review was unremarkable  Past Medical History  Diagnosis Date  . Mental retardation   . Cerebral palsy   . Esophageal reflux   . Seizure   . Vision abnormalities    Hospitalizations: no, Head Injury: no, Nervous System Infections: no, Immunizations up to date: yes Past Medical History Comments: Cardiac arrest in the nursery following surgical repair of gastroschisis.  EEG November 23, 2006 10 - 11 Hz dominant posterior rhythm that was normal with generalized slowing right posterior region greater than left in bursts of independent bilateral electrographic seizure activity for 30 seconds in duration of 2-3 Hz spike and slow-wave discharges equally divided between the left and right hemispheres.  There is also a right temporal focus of generalized to both hemispheres.  Clinical behaviors were not seen.   Behavior History none  Surgical History Past Surgical History  Procedure Laterality Date  . Peg placement  12/07/2011    Procedure: PERCUTANEOUS ENDOSCOPIC GASTROSTOMY (PEG) REPLACEMENT;  Surgeon: Graylin Shiver, MD;  Location: [facility];  Service: Endoscopy;  Laterality: N/A;  20 french 4.4 cent.  . Nissen fundoplication  1986  . Peg placement N/A 01/04/2013    Procedure: PERCUTANEOUS ENDOSCOPIC GASTROSTOMY (PEG) REPLACEMENT;  Surgeon: Barrie Folk, MD;  Location: [facility];  Service: Endoscopy;  Laterality: N/A;  Gastroschisis repair as an infant.  Family History family history includes Migraines in his mother; Other in his paternal grandfather; Seizures in his brother and mother. Family History is negative migraines, seizures, cognitive impairment, blindness, deafness, birth defects, chromosomal disorder, autism.  Social History History   Social  History  . Marital Status: Single    Spouse Name: N/A    Number of Children: N/A  . Years of Education: N/A   Social History Main Topics  . Smoking status: Never Smoker   . Smokeless tobacco: Never Used  . Alcohol Use: No  . Drug Use: No  . Sexual Activity: No   Other Topics Concern  . None   Social History Narrative  . None   Educational level 12th grade Living with parents and brothers  School comments: [employer] graduated from [employer] in 2012.  Current Outpatient Prescriptions on File Prior to Visit  Medication Sig Dispense Refill  . carBAMazepine (TEGRETOL) 100 MG/5ML suspension TAKE 15 MLS BY MOUTH 3 TIMES A DAY  1400 mL  0  . glycopyrrolate (ROBINUL) 1 MG tablet TAKE 1 TABLET BY MOUTH TWICE A DAY  62 tablet  0  . primidone (MYSOLINE) 250 MG tablet TAKE 1/2 A TABLET BY MOUTH 3 TIMES A DAY  50 tablet  0  . Valproic Acid (DEPAKENE) 250 MG/5ML SYRP syrup TAKE 15 MLS BY MOUTH THREE TIMES DAILY  1400 mL  0   No current facility-administered medications on file prior to visit.   The medication list was reviewed and reconciled. All changes or newly prescribed medications were explained.  A complete medication list was provided to the patient/caregiver.  Allergies  Allergen Reactions  . Augmentin [Amoxicillin-Pot Clavulanate] Diarrhea  . Powders [Talc] Rash    From powdered gloves   Physical Exam BP 90/60  Pulse 88  Wt 120 lb (54.432 kg)  General: Disinterested, well developed, well nourished, in no acute distress,  right-handed, sandy hair, blue eyes Head: mildly microcephalic, no dysmorphic features; his hair is eroded from rubbing the back of his head Ears, Nose and Throat: Otoscopic: tympanic membranes normal .  Pharynx: oropharynx is pink without exudates or tonsillar hypertrophy. Neck: supple, full range of motion, no cranial or cervical bruits Respiratory: auscultation clear Cardiovascular: no murmurs, pulses are normal Musculoskeletal:   The patient  has left convex thoracolumbar scoliosis.  He has tight shoulders,  bilateral elbow contractures left greater than right, contractures of the knees were equal;  contractures at the wrists left greater than right, clawhand deformity and left, greater ankle excursion on the left and the right but neither are particularly tight; he has contractures at both knees, equinus deformities of both ankles, Subluxation of the left hip with good internal/external rotation on the right.  Bony prominence on the external aspect of his left foot and some form of cyst along the plantar muscles of the left foot. Skin: no rashes or neurocutaneous lesions Trunk:  He has a percutaneous gastrostomy in the left epigastric region  Neurologic Exam  Mental Status:  severe retardation, no language, he takes no note of the examiner except touch. Cranial Nerves: The patient is cortically blind. He has reactive pupils.  Positive red reflex, photophobia, he drools. There is lower facial weakness.  He does not move his tongue well.   It protrudes slightly to the right   He will orient his head in the direction of sounds. Motor: Spastic quadriparesis with some sparing of the right arm which moves better than  the left. Left hand is tightly fisted.  He is slumped to the right side of his chair. Sensory:  withdrawal x4, right better than left  Coordination: cannot test Gait and Station:  wheelchair-bound, head tends to droop to his chest Reflexes: symmetric and brisk bilaterally; no clonus; bilateral extensor plantar responses.  Assessment 1. 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 General: alert, well developed, well nourished, in no acute distress,  right-handed, sandy hair, blue eyes Head: mildly microcephalic, no dysmorphic features; his hair is eroded from rubbing the back of his head Ears, Nose and Throat: Otoscopic: tympanic membranes normal .  Pharynx: oropharynx is pink without exudates or tonsillar  hypertrophy. Neck: supple, full range of motion, no cranial or cervical bruits Respiratory: auscultation clear Cardiovascular: no murmurs, pulses are normal Musculoskeletal:   The patient has left convex thoracolumbar scoliosis.  He has tight shoulders  bilateral elbow contractures left greater than right  contractures of the knees were equal;  contractures at the wrists left greater than right, clawhand deformity and left, greater ankle excursion on the left and the right but neither are particularly tight; he has contractures at both knees area Skin: no rashes or neurocutaneous lesions Trunk:  He has a percutaneous gastrostomy in the left epigastric region  Neurologic Exam  Mental Status:  severe retardation, no language, he takes no note of the examiner except touch. Cranial Nerves: The patient is cortically blind. He has reactive pupils.  Positive red reflex He drools. There is lower facial weakness.He does not move his tongue well.   It protrudes slightly to the right   He will orient his head in the direction of sounds. Motor: Spastic quadriparesis with some sparing of the right arm which moves better than the left. Left hand is tightly fisted.. he is slumped to the right side of his chair. Sensory:  withdrawal x4, right better than left  Coordination: cannot test Gait and Station:  wheelchair-bound, head tends to droop to his chest Reflexes: symmetric and brisk bilaterally; no clonus; bilateral extensor plantar responses.  Assessment 1.   Generalized convulsive epilepsy, well-controlled, 345.10. 2.   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 3.   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   Dysphagia or pharyngeal phase, 787.22. 5.   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 6.   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  Plan Continue his current medications.  I refilled his valproic acid, primidone, glycopyrrolate, and carbamazepine.  I spent 30 minutes of  face-to-face time with the patient and his mother, more than half of it in consultation.  I will plan to see him in a year, sooner depending upon clinical need.  I will need a refill his prescriptions at six months.  Deetta Perla MD

## 2013-05-10 NOTE — Telephone Encounter (Signed)
Medications were refilled on 05/03/13. TG

## 2013-11-13 ENCOUNTER — Other Ambulatory Visit: Payer: Self-pay | Admitting: Family

## 2013-11-13 DIAGNOSIS — G40309 Generalized idiopathic epilepsy and epileptic syndromes, not intractable, without status epilepticus: Secondary | ICD-10-CM

## 2013-11-13 MED ORDER — PRIMIDONE 250 MG PO TABS
ORAL_TABLET | ORAL | Status: DC
Start: 2013-11-13 — End: 2014-07-18

## 2013-11-14 ENCOUNTER — Other Ambulatory Visit: Payer: Self-pay

## 2013-11-14 DIAGNOSIS — G808 Other cerebral palsy: Secondary | ICD-10-CM

## 2013-11-14 DIAGNOSIS — G40309 Generalized idiopathic epilepsy and epileptic syndromes, not intractable, without status epilepticus: Secondary | ICD-10-CM

## 2013-11-14 DIAGNOSIS — R1312 Dysphagia, oropharyngeal phase: Secondary | ICD-10-CM

## 2013-11-14 MED ORDER — VALPROIC ACID 250 MG/5ML PO SYRP: ORAL_SOLUTION | ORAL | Status: DC

## 2013-11-14 MED ORDER — GLYCOPYRROLATE 1 MG PO TABS: ORAL_TABLET | ORAL | Status: DC

## 2013-11-14 MED ORDER — CARBAMAZEPINE 100 MG/5ML PO SUSP: ORAL | Status: DC

## 2014-04-04 ENCOUNTER — Other Ambulatory Visit: Payer: Self-pay | Admitting: Gastroenterology

## 2014-04-05 ENCOUNTER — Encounter (HOSPITAL_COMMUNITY)
Admission: RE | Disposition: A | Discharge status: Home / Self Care | Payer: Self-pay | Source: Ambulatory Visit | Attending: Gastroenterology

## 2014-04-05 ENCOUNTER — Encounter (HOSPITAL_COMMUNITY): Payer: Self-pay | Admitting: Gastroenterology

## 2014-04-05 ENCOUNTER — Ambulatory Visit (HOSPITAL_COMMUNITY)
Admission: RE | Admit: 2014-04-05 | Discharge: 2014-04-05 | Disposition: A | Discharge status: Home / Self Care | Payer: Medicaid Other | Source: Ambulatory Visit | Attending: Gastroenterology | Admitting: Gastroenterology

## 2014-04-05 DIAGNOSIS — Z79899 Other long term (current) drug therapy: Secondary | ICD-10-CM | POA: Diagnosis not present

## 2014-04-05 DIAGNOSIS — F79 Unspecified intellectual disabilities: Secondary | ICD-10-CM | POA: Diagnosis not present

## 2014-04-05 DIAGNOSIS — G40909 Epilepsy, unspecified, not intractable, without status epilepticus: Secondary | ICD-10-CM | POA: Diagnosis not present

## 2014-04-05 DIAGNOSIS — G809 Cerebral palsy, unspecified: Secondary | ICD-10-CM | POA: Diagnosis not present

## 2014-04-05 DIAGNOSIS — K9423 Gastrostomy malfunction: Secondary | ICD-10-CM | POA: Diagnosis present

## 2014-04-05 HISTORY — PX: BUTTON CHANGE: SHX5423

## 2014-04-05 SURGERY — REPLACEMENT, BUTTON GASTROSTOMY DEVICE

## 2014-04-05 NOTE — Op Note (Signed)
Procedure: PEG replacement sedation: none description procedure: unfortunate young man with cerebral palsy. This had PEG tube in for some time last replace 5/14. The tube has become nonfunctional. This was removed easily. This site was not infected. At 20 French low-profile balloon button 4.4 cm was easily placed into the track. The balloon was inflated with 10 cc of sterile water. The tube easily flushed.

## 2014-04-05 NOTE — H&P (Signed)
  Subjective:   Patient is a 23 y.o. male presents with peg tube malfunction. He has had a peg to for some time had to have this replaced. It appears from the records as was last replaced 5/14. Procedure including risks and benefits discussed in office.  Patient Active Problem List   Diagnosis Date Noted  . Generalized convulsive epilepsy without mention of intractable epilepsy 04/11/2013  . Generalized nonconvulsive epilepsy without mention of intractable epilepsy 04/11/2013  . Myoclonus 04/11/2013  . Congenital quadriplegia 04/11/2013  . Dysphagia, oropharyngeal phase 04/11/2013  . Scoliosis associated with other condition 04/11/2013  . 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 Mental retardation   . Cerebral palsy   . Esophageal reflux   . Seizure   . Vision abnormalities     Past Surgical History  Procedure Laterality Date  . Peg placement  12/07/2011    Procedure: PERCUTANEOUS ENDOSCOPIC GASTROSTOMY (PEG) REPLACEMENT;  Surgeon: Graylin Shiver, MD;  Location: [facility];  Service: Endoscopy;  Laterality: N/A;  20 french 4.4 cent.  . Nissen fundoplication  1986  . Peg placement N/A 01/04/2013    Procedure: PERCUTANEOUS ENDOSCOPIC GASTROSTOMY (PEG) REPLACEMENT;  Surgeon: Barrie Folk, MD;  Location: [facility];  Service: Endoscopy;  Laterality: N/A;    Prescriptions prior to admission  Medication Sig Dispense Refill  . albuterol (PROVENTIL) (2.5 MG/3ML) 0.083% nebulizer solution Take 2.5 mg by nebulization every 6 (six) hours as needed for wheezing.      . budesonide (PULMICORT) 0.5 MG/2ML nebulizer solution Take 0.5 mg by nebulization 2 (two) times daily.      . carBAMazepine (TEGRETOL) 100 MG/5ML suspension TAKE 15 mLs BY MOUTH 3 TIMES A DAY  1400 mL  5  . glycopyrrolate (ROBINUL) 1 MG tablet TAKE 1 TABLET BY MOUTH TWICE A DAY  62 tablet  5  . primidone (MYSOLINE) 250 MG tablet TAKE 1/2  TABLET BY MOUTH  3 TIMES A DAY  50 tablet  5  . Valproic Acid (DEPAKENE) 250 MG/5ML SYRP syrup TAKE 15 mLs BY MOUTH THREE TIMES DAILY  1400 mL  5   Allergies  Allergen Reactions  . Augmentin [Amoxicillin-Pot Clavulanate] Diarrhea  . Powders [Talc] Rash    From powdered gloves    History  Substance Use Topics  . Smoking status: Never Smoker   . Smokeless tobacco: Never Used  . Alcohol Use: No    Family History  Problem Relation Age of Onset  . Seizures Mother   . Migraines Mother   . Seizures Brother   . Other Paternal Grandfather     Died in his 88's from multiple health problems     Objective:   No data found.        See MD Preop evaluation      Assessment:   1. Peg tube malfunction  Plan:   We will go ahead and replace the peg tube.

## 2014-04-05 NOTE — Addendum Note (Signed)
Addended by: Samuel Germany. on: 04/05/2014 10:15 AM   Modules accepted: Orders

## 2014-04-08 ENCOUNTER — Encounter (HOSPITAL_COMMUNITY): Payer: Self-pay | Admitting: Gastroenterology

## 2014-05-23 ENCOUNTER — Other Ambulatory Visit: Payer: Self-pay | Admitting: Family

## 2014-06-18 ENCOUNTER — Other Ambulatory Visit: Payer: Self-pay | Admitting: Family

## 2014-07-03 ENCOUNTER — Other Ambulatory Visit: Payer: Self-pay | Admitting: Family

## 2014-07-18 ENCOUNTER — Encounter: Payer: Self-pay | Admitting: Pediatrics

## 2014-07-18 ENCOUNTER — Ambulatory Visit (INDEPENDENT_AMBULATORY_CARE_PROVIDER_SITE_OTHER): Payer: Medicaid Other | Admitting: Pediatrics

## 2014-07-18 VITALS — BP 90/60 | HR 84 | Wt 120.0 lb

## 2014-07-18 DIAGNOSIS — R1312 Dysphagia, oropharyngeal phase: Secondary | ICD-10-CM

## 2014-07-18 DIAGNOSIS — G40309 Generalized idiopathic epilepsy and epileptic syndromes, not intractable, without status epilepticus: Secondary | ICD-10-CM

## 2014-07-18 DIAGNOSIS — M4147 Neuromuscular scoliosis, lumbosacral region: Secondary | ICD-10-CM

## 2014-07-18 DIAGNOSIS — H47619 Cortical blindness, unspecified side of brain: Secondary | ICD-10-CM

## 2014-07-18 DIAGNOSIS — G808 Other cerebral palsy: Secondary | ICD-10-CM

## 2014-07-18 DIAGNOSIS — G253 Myoclonus: Secondary | ICD-10-CM

## 2014-07-18 MED ORDER — GLYCOPYRROLATE 1 MG PO TABS: ORAL_TABLET | ORAL | Status: DC

## 2014-07-18 MED ORDER — CARBAMAZEPINE 100 MG/5ML PO SUSP: ORAL | Status: DC

## 2014-07-18 MED ORDER — VALPROIC ACID 250 MG/5ML PO SYRP: ORAL_SOLUTION | ORAL | Status: DC

## 2014-07-18 MED ORDER — PRIMIDONE 250 MG PO TABS: ORAL_TABLET | ORAL | Status: DC

## 2014-07-18 NOTE — Progress Notes (Signed)
Patient: Wayne Meyers MRN: [id number] Sex: male DOB: 01-16-1991  Provider: Deetta PerlaHICKLING,WILLIAM H, MD [facility]  Note type: Routine return visit  History of Present Illness: Referral Source: Dr. Irena Reichmannana Collins  [facility]: [facility] and [facility] Chief Complaint: Epilepsy   Wayne Meyers is a 23 y.o. male who was evaluated July 18, 2014 for the first time since May 03, 2013.  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  He came today with his father.  His mother left in the spring of 2015.  His father has assistance with [employer] workers eight hours a day, seven days a week and has respite care.  He runs a business and he is doing well with his business and taking care of his three children.  Wayne Meyers sometimes gets his days and nights mixed up, but he typically is placed in bed between 10 and 11 and gets up around 6.  On occasion, he naps during the daytime.  His father is at work and the [employer] workers allow him to do so.  He is feeding has been changed from Jevity 1.0 to 1.5 to decrease the amount of liquid.  This is a more dense formula.  It also has probiotics.  This has helped his constipation.  He receives all feeding and medicines through his gastrostomy tube.  Recently, he had some form of perineal rash that was thought to represent fungus, however, when he was treated for fungus, the symptoms worsened.  It has now healed nicely.  He also had had his gastrostomy tube replaced.  It is a 22-French, the procedure was [facility].  He is on three antiepileptic medications.  Seizures are under complete control.  I have no reason to change these medicines.  He has  tolerated them well.  He has had no side effects.  I do not want to make changes.  He takes Robinul for oral secretions.  Review of Systems: 12 system review was remarkable for seizures   Past Medical History Diagnosis Date  . Mental retardation   . Cerebral palsy   . Esophageal reflux   . Seizure   . Vision abnormalities    Hospitalizations: No., Head Injury: No., Nervous System Infections: No., Immunizations up to date: Yes.    Cardiac arrest in the nursery following surgical repair of gastroschisis.  EEG November 23, 2006 10 - 11 Hz dominant posterior rhythm that was normal with generalized slowing right posterior region greater than left in bursts of independent bilateral electrographic seizure activity for 30 seconds in duration of 2-3 Hz spike and slow-wave discharges equally divided between the left and right hemispheres. There is also a right temporal focus of generalized to both hemispheres. Clinical behaviors were not seen.   Scratched left eye in September of 2015 it healed up fine and then he developed a slight skin infection and he recovered fine from that incident.   Behavior History none  Surgical History Procedure Laterality Date  . Peg placement  12/07/2011    Procedure: PERCUTANEOUS ENDOSCOPIC GASTROSTOMY (PEG) REPLACEMENT;  Surgeon: Graylin ShiverSalem F Ganem, MD;  Location: [facility];  Service: Endoscopy;  Laterality: N/A;  20 french 4.4 cent.  . Nissen fundoplication  1986  . Peg placement N/A 01/04/2013  Procedure: PERCUTANEOUS ENDOSCOPIC GASTROSTOMY (PEG) REPLACEMENT;  Surgeon: Barrie FolkJohn C Hayes, MD;  [facility];  Service: Endoscopy;  Laterality: N/A;  Tomma Lightning. Button change N/A 04/05/2014    Procedure: BUTTON CHANGE;  Surgeon: Vertell NovakJames L Edwards Jr., MD;  Location: [facility];  Service: Endoscopy;  Laterality: N/A;  . Circumcision  1990   Family History family history includes Migraines in his mother; Other in his paternal grandfather; Seizures in his brother and  mother. Family history is negative for intellectual disabilities, blindness, deafness, birth defects, chromosomal disorder, or autism.  Social History . Marital Status: Single    Spouse Name: N/A    Number of Children: N/A  . Years of Education: N/A   Social History Main Topics  . Smoking status: Never Smoker   . Smokeless tobacco: Never Used  . Alcohol Use: No  . Drug Use: No  . Sexual Activity: No   Social History Narrative  Educational level special education Living with father and brothers  , mother has left the home Hobbies/Interest: Enjoys playing with his toys  School comments Wayne Meyers completed his education at [employer] in 2012.   Allergies Allergen Reactions  . Augmentin [Amoxicillin-Pot Clavulanate] Diarrhea  . Powders [Talc] Rash    From powdered gloves   Physical Exam BP 90/60 mmHg  Pulse 84  Ht   Wt 120 lb (54.432 kg)  General: Disinterested, well developed, well nourished, in no acute distress, right-handed, sandy hair, blue eyes Head: mildly microcephalic, no dysmorphic features; his hair is eroded from rubbing the back of his head Ears, Nose and Throat: Otoscopic: tympanic membranes normal . Pharynx: oropharynx is pink without exudates or tonsillar hypertrophy. Neck: supple, full range of motion, no cranial or cervical bruits Respiratory: auscultation clear Cardiovascular: no murmurs, pulses are normal Musculoskeletal: The patient has left convex thoracolumbar scoliosis. He has tight shoulders, bilateral elbow contractures left greater than right, contractures of the knees were equal; contractures at the wrists left greater than right, clawhand deformity and left, greater ankle excursion on the left and the right but neither are particularly tight; he has contractures at both knees, equinus deformities of both ankles, Subluxation of the left hip with good internal/external rotation on the right. Bony prominence on the external aspect of his  left foot and some form of cyst along the plantar muscles of the left foot. Skin: no rashes or neurocutaneous lesions Trunk: He has a percutaneous gastrostomy in the left epigastric region  Neurologic Exam  Mental Status: severe retardation, no language, he takes no note of the examiner except when touched. Cranial Nerves: The patient is cortically blind. He has reactive pupils. Positive red reflex, photophobia, he drools. There is lower facial weakness. He does not move his tongue well. It protrudes slightly to the right He will orient his head in the direction of sounds. Motor: Spastic quadriparesis with some sparing of the right arm which moves better than the left. Left hand is tightly fisted. He is slumped to the right side of his chair. Sensory: withdrawal x4, right better than left  Coordination: cannot test Gait and Station: wheelchair-bound, head tends to droop to his chest Reflexes: symmetric and brisk bilaterally; no clonus; bilateral extensor plantar responses.  Assessment 1. 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 2. Generalized convulsive epilepsy, G40.309. 3. Generalized non-convulsive epilepsy, G40.309. 4. Myoclonus, G25.3. 5. 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 6. 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 7. Dysphagia oropharyngeal phase, R13.12.  Discussion Wayne Meyers is stable.  He has severe neurologic problems that are addressed as best they can be by his father and the [employer] workers.  His father often gets durable equipment on his own.  I told him that our office could help him with that if he requested it.  I have not seen him in some time.  He has been seen by my nurse practitioner, Elveria Rising.  I praised his father for his excellent work.    Plan I refilled prescriptions for the antiepileptic medicines and Robinul.  He will return to see me in one year, sooner depending upon  clinical need.  I spent 30 minutes of face-to-face time with Kaison and his father, more than half of it in consultation.   Medication List   This list is accurate as of: 07/18/14 11:59 PM.       albuterol (2.5 MG/3ML) 0.083% nebulizer solution  Commonly known as:  PROVENTIL  Take 2.5 mg by nebulization every 6 (six) hours as needed for wheezing.     carBAMazepine 100 MG/5ML suspension  Commonly known as:  TEGRETOL  TAKE 15 MLS BY MOUTH 3 TIMES A DAY     glycopyrrolate 1 MG tablet  Commonly known as:  ROBINUL  Take 1 tablet by mouth twice daily     primidone 250 MG tablet  Commonly known as:  MYSOLINE  TAKE 1/2  TABLET BY MOUTH 3 TIMES A DAY     PULMICORT 0.5 MG/2ML nebulizer solution  Generic drug:  budesonide  Take 0.5 mg by nebulization 2 (two) times daily.     Valproic Acid 250 MG/5ML Syrp syrup  Commonly known as:  DEPAKENE  TAKE 15 MLS BY MOUTH THREE TIMES DAILY      The medication list was reviewed and reconciled. All changes or newly prescribed medications were explained.  A complete medication list was provided to the patient/caregiver.  Deetta Perla MD

## 2014-07-31 ENCOUNTER — Other Ambulatory Visit: Payer: Self-pay | Admitting: Family

## 2014-08-19 ENCOUNTER — Telehealth: Payer: Self-pay | Admitting: *Deleted

## 2014-08-19 DIAGNOSIS — G808 Other cerebral palsy: Secondary | ICD-10-CM

## 2014-08-19 DIAGNOSIS — M4147 Neuromuscular scoliosis, lumbosacral region: Secondary | ICD-10-CM

## 2014-08-19 DIAGNOSIS — G40309 Generalized idiopathic epilepsy and epileptic syndromes, not intractable, without status epilepticus: Secondary | ICD-10-CM

## 2014-08-19 DIAGNOSIS — R1312 Dysphagia, oropharyngeal phase: Secondary | ICD-10-CM

## 2014-08-19 NOTE — Telephone Encounter (Signed)
Wayne RawGregory Meyers, father and guardian, stated that he needs help with getting a new bed. The pt has a bed that he had for 12-13 years. The father said that he needs a hospital type bed with two rails and a motor lift for head and legs. [facility] needs a script and documentation for the hospital bed. Their fax number is [phone]. The father can be reached at [phone].

## 2014-08-20 NOTE — Telephone Encounter (Signed)
The father was notified.

## 2014-08-20 NOTE — Telephone Encounter (Signed)
Please let Dad know that the order was faxed to [facility]. Thanks, Inetta Fermoina

## 2015-02-10 ENCOUNTER — Other Ambulatory Visit: Payer: Self-pay | Admitting: Pediatrics

## 2015-03-11 ENCOUNTER — Other Ambulatory Visit: Payer: Self-pay | Admitting: Pediatrics

## 2015-03-18 ENCOUNTER — Other Ambulatory Visit: Payer: Self-pay | Admitting: Gastroenterology

## 2015-03-18 NOTE — Addendum Note (Signed)
Addended by: Charlott Rakes on: 03/18/2015 04:25 PM   Modules accepted: Orders

## 2015-03-19 ENCOUNTER — Encounter (HOSPITAL_COMMUNITY)
Admission: RE | Disposition: A | Discharge status: Home / Self Care | Payer: Self-pay | Source: Ambulatory Visit | Attending: Gastroenterology

## 2015-03-19 ENCOUNTER — Encounter (HOSPITAL_COMMUNITY): Payer: Self-pay | Admitting: *Deleted

## 2015-03-19 ENCOUNTER — Ambulatory Visit (HOSPITAL_COMMUNITY)
Admission: RE | Admit: 2015-03-19 | Discharge: 2015-03-19 | Disposition: A | Discharge status: Home / Self Care | Payer: Medicaid Other | Source: Ambulatory Visit | Attending: Gastroenterology | Admitting: Gastroenterology

## 2015-03-19 DIAGNOSIS — G809 Cerebral palsy, unspecified: Secondary | ICD-10-CM | POA: Insufficient documentation

## 2015-03-19 DIAGNOSIS — Z431 Encounter for attention to gastrostomy: Secondary | ICD-10-CM | POA: Insufficient documentation

## 2015-03-19 HISTORY — PX: PEG PLACEMENT: SHX5437

## 2015-03-19 SURGERY — REPLACEMENT, PEG TUBE, WITHOUT ENDOSCOPY

## 2015-03-19 NOTE — Procedures (Signed)
24 yo with cerebral palsy brought in by his father due to dysfunction with his current button PEG tube that was last changed in 03/2014. The current 20 F- 4.4 cm button PEG tube was removed after deflating the 10 ml balloon. The site was not infected. After removal of the current button PEG a new 20 F 4.4 cm button PEG was inserted into the abdominal wall track and the 10 ml balloon was inflated with 10 ml(cc) of sterile water. The PEG was dressed and the extension tubing was attached and was working properly.

## 2015-03-19 NOTE — H&P (Signed)
  Date of Initial H&P: 03/18/15.  History reviewed, patient examined, no change in status, stable for surgery. PEG button change.

## 2015-03-20 ENCOUNTER — Encounter (HOSPITAL_COMMUNITY): Payer: Self-pay | Admitting: Gastroenterology

## 2015-06-16 ENCOUNTER — Other Ambulatory Visit: Payer: Self-pay | Admitting: Family

## 2015-06-16 DIAGNOSIS — G40309 Generalized idiopathic epilepsy and epileptic syndromes, not intractable, without status epilepticus: Secondary | ICD-10-CM

## 2015-06-16 MED ORDER — TEGRETOL 100 MG/5ML PO SUSP: ORAL | Status: DC

## 2015-07-21 ENCOUNTER — Ambulatory Visit (INDEPENDENT_AMBULATORY_CARE_PROVIDER_SITE_OTHER): Payer: Medicaid Other | Admitting: Family

## 2015-07-21 ENCOUNTER — Encounter: Payer: Self-pay | Admitting: Family

## 2015-07-21 VITALS — BP 94/60 | HR 86

## 2015-07-21 DIAGNOSIS — G253 Myoclonus: Secondary | ICD-10-CM

## 2015-07-21 DIAGNOSIS — M4147 Neuromuscular scoliosis, lumbosacral region: Secondary | ICD-10-CM | POA: Diagnosis not present

## 2015-07-21 DIAGNOSIS — G40309 Generalized idiopathic epilepsy and epileptic syndromes, not intractable, without status epilepticus: Secondary | ICD-10-CM

## 2015-07-21 DIAGNOSIS — R1312 Dysphagia, oropharyngeal phase: Secondary | ICD-10-CM

## 2015-07-21 DIAGNOSIS — H47619 Cortical blindness, unspecified side of brain: Secondary | ICD-10-CM | POA: Diagnosis not present

## 2015-07-21 DIAGNOSIS — G808 Other cerebral palsy: Secondary | ICD-10-CM

## 2015-07-21 MED ORDER — DEPAKENE 250 MG/5ML PO SYRP: ORAL_SOLUTION | ORAL | Status: DC

## 2015-07-21 MED ORDER — GLYCOPYRROLATE 1 MG PO TABS: 1.0000 mg | ORAL_TABLET | Freq: Two times a day (BID) | ORAL | Status: DC

## 2015-07-21 MED ORDER — PRIMIDONE 250 MG PO TABS: ORAL_TABLET | ORAL | Status: DC

## 2015-07-21 MED ORDER — TEGRETOL 100 MG/5ML PO SUSP: ORAL | Status: DC

## 2015-07-21 NOTE — Progress Notes (Signed)
Patient: Wayne Meyers MRN: [id number] Sex: male DOB: 1991-01-13  Provider: Elveria Risingina Shailyn Weyandt, NP [facility]  Note type: Routine return visit  History of Present Illness: Referral Source: Irena Reichmannana Collins, MD History from: father and [facility] chart Chief Complaint: Epilepsy  Wayne Meyers is a 24 y.o. 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 Wayne Meyers was last seen by Dr Sharene SkeansHickling on July 18, 2014.  Wayne Meyers is cared for at home by his father and [employer] workers. He was approved for assistance 8 hours per day, 7 days per week, along with respite care. Dad says that he has been having difficulty keeping a worker because of staffing, and he has been looking into changing agencies. Dad is understandably frustrated with this problem as he also runs a business and cares for his other two children as a single parent.  Dad reports today that Wayne Meyers has been generally healthy since he was last seen. He suffered a lower leg fracture during repositioning and has been followed up by orthopedics for that. He also had his gastrostomy tube replaced a few months ago. Dad says that Wayne Meyers generally sleeps well. He has some nights in which he remains awake but in general he does fairly well. When he remains awake at night, he will then sleep during the day and Dad has to work to get him back on schedule.  Wayne Meyers is taking and tolerating Depakene, Tegretol and Primidone for his seizure disorder. He has remained seizure free on this regimen. Wayne Meyers takes Robinul for excessive oral secretions.    Wayne Meyers's father has no other health concerns for him today other than previously mentioned.  Review of  Systems: Please see the HPI for neurologic and other pertinent review of systems. Otherwise, the following systems are noncontributory including constitutional, eyes, ears, nose and throat, cardiovascular, respiratory, gastrointestinal, genitourinary, musculoskeletal, skin, endocrine, hematologic/lymph, allergic/immunologic and psychiatric.   Past Medical History  Diagnosis Date  . Mental retardation   . Cerebral palsy (HCC)   . Esophageal reflux   . Seizure (HCC)   . Vision abnormalities    Hospitalizations: No., Head Injury: No., Nervous System Infections: No., Immunizations up to date: Yes.   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  EEG November 23, 2006 10 - 11 Hz dominant posterior rhythm that was normal with generalized slowing right posterior region greater than left in bursts of independent bilateral electrographic seizure activity for 30 seconds in duration of 2-3 Hz spike and slow-wave discharges equally divided between the left and right hemispheres. There is also a right temporal focus of generalized to both hemispheres. Clinical behaviors were not seen.   Scratched left eye in September of 2015, healed without difficulty.  Fracture of left lower leg in 2016, healed without difficulty  Surgical History Past Surgical History  Procedure Laterality Date  . Peg placement  12/07/2011    Procedure: PERCUTANEOUS ENDOSCOPIC GASTROSTOMY (PEG) REPLACEMENT;  Surgeon: Graylin ShiverSalem F Ganem, MD;  Location: [facility];  Service: Endoscopy;  Laterality: N/A;  20 french 4.4 cent.  . Nissen fundoplication  1986  . Peg placement N/A 01/04/2013    Procedure: PERCUTANEOUS ENDOSCOPIC GASTROSTOMY (PEG) REPLACEMENT;  Surgeon: Barrie FolkJohn C Hayes, MD;  [facility];  Service: Endoscopy;  Laterality: N/A;  Tomma Lightning change N/A 04/05/2014    Procedure: BUTTON CHANGE;  Surgeon: Vertell Novak., MD;  Location: [facility];  Service:  Endoscopy;  Laterality: N/A;  . Circumcision  1990  . Peg placement N/A 03/19/2015    Procedure: PERCUTANEOUS ENDOSCOPIC GASTROSTOMY (PEG) REPLACEMENT;  Surgeon: Charlott Rakes, MD;  Location: [facility];  Service: Endoscopy;  Laterality: N/A;    Family History family history includes Migraines in his mother; Other in his paternal grandfather; Seizures in his brother and mother. Family History is otherwise negative for migraines, seizures, cognitive impairment, blindness, deafness, birth defects, chromosomal disorder, autism.  Social History Social History   Social History  . Marital Status: Single    Spouse Name: N/A  . Number of Children: N/A  . Years of Education: N/A   Social History Main Topics  . Smoking status: Never Smoker   . Smokeless tobacco: Never Used  . Alcohol Use: No  . Drug Use: No  . Sexual Activity: No   Other Topics Concern  . None   Social History Narrative   Wayne Meyers is not currently enrolled in a day program or school. He stays home and has a CNA helping with care. Dad has been having difficulty with continuous care for Wayne Meyers since October 2016. Dad reports to no longer being with patient's mother since the beginning of 2015, mother moved to [place]. Lavern enjoys playing with toys. He lives with his father and brothers.      Allergies Allergies  Allergen Reactions  . Augmentin [Amoxicillin-Pot Clavulanate] Diarrhea  . Powders [Talc] Rash    From powdered gloves    Physical Exam BP 94/60 mmHg  Pulse 86 General: Awake but displays no interest in surroundings, well developed, well nourished, in no acute distress, right-handed, sandy hair, blue eyes Head: mildly microcephalic, no dysmorphic features; his hair is eroded from rubbing the back of his head Ears, Nose and Throat: Otoscopic: tympanic membranes normal . Pharynx: oropharynx is pink without exudates or tonsillar hypertrophy. Neck: supple, full range of motion, no cranial or cervical  bruits Respiratory: auscultation clear Cardiovascular: no murmurs, pulses are normal Musculoskeletal: The patient has left convex thoracolumbar scoliosis. He has tight shoulders, bilateral elbow contractures left greater than right, contractures of the knees were equal; contractures at the wrists left greater than right, clawhand deformity and left, greater ankle excursion on the left and the right but neither are particularly tight; he has contractures at both knees, equinus deformities of both ankles, Subluxation of the left hip with good internal/external rotation on the right. Bony prominence on the external aspect of his left foot and some form of cyst along the plantar muscles of the left foot. Skin: no rashes or neurocutaneous lesions Trunk: He has a percutaneous gastrostomy in the left epigastric region  Neurologic Exam  Mental Status: severe retardation, no language, he takes no note of the examiner except when touched. Cranial Nerves: The patient is cortically blind. He has reactive pupils. Positive red reflex, photophobia, he drools. There is lower facial weakness. He does not move his tongue well. It protrudes slightly to the right He will orient his head in the direction of sounds. Motor: Spastic quadriparesis with some sparing of the right arm which moves better than the left. Left hand is tightly fisted. He is slumped to the right side of his chair. Sensory: withdrawal x4, right better than left  Coordination: cannot test Gait and Station: wheelchair-bound, head tends to  droop to his chest Reflexes: symmetric and brisk bilaterally; no clonus; bilateral extensor plantar responses.  Impression 1. 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 2. Generalized convulsive epilepsy, G40.309. 3. Generalized non-convulsive epilepsy, G40.309. 4. Myoclonus, G25.3. 5. 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 6. Neuromuscular  scoliosis of lumbosacral origin, M41.47. 7. Dysphagia oropharyngeal phase, R13.12   Recommendations for plan of care The patient's previous [facility] records were reviewed. Keanen has neither had nor required imaging or lab studies since the last visit, other than for replacement of his gastrostomy tube. Dad said that he had repeated x-rays for his left lower leg fracture at orthopedic office. 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 He is taking and tolerating Depakene, Tegretol and Primidone for his seizure disorder and has remained seizure free on these medications. He will continue these medications without change for now. I commended Dad for his devotion to [place]. I will be happy to sign forms etc as needed if he engages a new home services agency for [place]. I will otherwise see Wayne Meyers back in follow up in 1 year or sooner if needed.   The medication list was reviewed and reconciled.  No changes were made in the prescribed medications today.  A complete medication list was provided to the patient's father.  Total time spent with the patient was 30 minutes, of which 50% or more was spent in counseling and coordination of care.

## 2015-07-21 NOTE — Patient Instructions (Signed)
Continue Wayne Meyers's medication has you have been giving them. Call if he has increase in seizure frequency or severity.   Call if you need any letters written for support of in home services [name].   Otherwise please plan to return for follow up in 1 year or sooner if needed.

## 2015-08-10 ENCOUNTER — Other Ambulatory Visit: Payer: Self-pay | Admitting: Family

## 2015-08-12 ENCOUNTER — Other Ambulatory Visit: Payer: Self-pay | Admitting: Family

## 2015-08-12 DIAGNOSIS — G40309 Generalized idiopathic epilepsy and epileptic syndromes, not intractable, without status epilepticus: Secondary | ICD-10-CM

## 2015-08-12 MED ORDER — DEPAKENE 250 MG/5ML PO SYRP: ORAL_SOLUTION | ORAL | Status: DC

## 2016-01-05 ENCOUNTER — Emergency Department (HOSPITAL_BASED_OUTPATIENT_CLINIC_OR_DEPARTMENT_OTHER)
Admission: EM | Admit: 2016-01-05 | Discharge: 2016-01-05 | Disposition: A | Discharge status: Home / Self Care | Payer: Medicaid Other | Attending: Emergency Medicine | Admitting: Emergency Medicine

## 2016-01-05 ENCOUNTER — Encounter (HOSPITAL_BASED_OUTPATIENT_CLINIC_OR_DEPARTMENT_OTHER): Payer: Self-pay

## 2016-01-05 DIAGNOSIS — K9421 Gastrostomy hemorrhage: Secondary | ICD-10-CM | POA: Diagnosis present

## 2016-01-05 DIAGNOSIS — K9420 Gastrostomy complication, unspecified: Secondary | ICD-10-CM

## 2016-01-05 NOTE — ED Notes (Addendum)
This RN, Lupita Leashonna RN, and Dr. Read DriversMolpus at bedside. Instructions from pt's gastro were to place 20 fr foley tube in PEG opening until replacement is achieved. Pt has assumed latex allergy and closest latex free catheter available at this facility is 16 fr. 16 fr catheter placed by Dr. Read DriversMolpus, gastric residual noted when pulling back on syringe, flushes easily with water.

## 2016-01-05 NOTE — ED Provider Notes (Signed)
CSN: [id number]     Arrival date & time 01/05/16  0003 History  By signing my name below, I, Wayne Meyers, attest that this documentation has been prepared under the direction and in the presence of Paula Libra, MD. Electronically Signed: Soijett Meyers, ED Scribe. 01/05/2016. 12:25 AM.   Chief Complaint  Patient presents with  . Feeding tube issue      LEVEL 5 CAVEAT: MENTAL RETARDATION  The history is provided by a parent. No language interpreter was used.    HPI Comments: DEMETRICK EICHENBERGER is a 25 y.o. male with a PMHx of MR and cerebral palsy, who presents to the Emergency Department complaining of pulling out his gastrostomy tube at 10:30 PM tonight PTA. History provided by pt's father. There is slight associated bleeding. Father states that this has happened in the past and that he has had to have the tube replaced with a Foley catheter before. Pt's GI specialist is Dr. Matthias Hughs.    Past Medical History  Diagnosis Date  . Mental retardation   . Cerebral palsy (HCC)   . Esophageal reflux   . Seizure (HCC)   . Vision abnormalities    Past Surgical History  Procedure Laterality Date  . Peg placement  12/07/2011    Procedure: PERCUTANEOUS ENDOSCOPIC GASTROSTOMY (PEG) REPLACEMENT;  Surgeon: Graylin Shiver, MD;  Location: [facility] ENDOSCOPY;  Service: Endoscopy;  Laterality: N/A;  20 french 4.4 cent.  . Nissen fundoplication  1986  . Peg placement N/A 01/04/2013    Procedure: PERCUTANEOUS ENDOSCOPIC GASTROSTOMY (PEG) REPLACEMENT;  Surgeon: Barrie Folk, MD;  Location: [facility] ENDOSCOPY;  Service: Endoscopy;  Laterality: N/A;  Tomma Lightning change N/A 04/05/2014    Procedure: BUTTON CHANGE;  Surgeon: Vertell Novak., MD;  Location: [facility] ENDOSCOPY;  Service: Endoscopy;  Laterality: N/A;  . Circumcision  1990  . Peg placement N/A 03/19/2015    Procedure: PERCUTANEOUS ENDOSCOPIC GASTROSTOMY (PEG) REPLACEMENT;  Surgeon: Charlott Rakes, MD;  Location: [facility] ENDOSCOPY;  Service: Endoscopy;  Laterality: N/A;    Family History  Problem Relation Age of Onset  . Seizures Mother   . Migraines Mother   . Seizures Brother   . Other Paternal Grandfather     Died in his 71's from multiple health problems   Social History  Substance Use Topics  . Smoking status: Never Smoker   . Smokeless tobacco: Never Used  . Alcohol Use: No    Review of Systems  Unable to perform ROS: Patient nonverbal     Allergies  Augmentin and Powders  Home Medications   Prior to Admission medications   Medication Sig Start Date End Date Taking? Authorizing Provider  albuterol (PROVENTIL) (2.5 MG/3ML) 0.083% nebulizer solution Take 2.5 mg by nebulization every 6 (six) hours as needed for wheezing.    Historical Provider, MD  budesonide (PULMICORT) 0.5 MG/2ML nebulizer solution Take 0.5 mg by nebulization 2 (two) times daily.    Historical Provider, MD  DEPAKENE 250 MG/5ML syrup Take 15ml by g-tube 3 times per day 08/12/15   Elveria Rising, NP  glycopyrrolate (ROBINUL) 1 MG tablet Take 1 tablet (1 mg total) by mouth 2 (two) times daily. 07/21/15   Elveria Rising, NP  glycopyrrolate (ROBINUL) 1 MG tablet TAKE 1 TABLET BY MOUTH TWICE DAILY 08/12/15   Elveria Rising, NP  primidone (MYSOLINE) 250 MG tablet TAKE 1/2 TABLET BY MOUTH 3 TIMES A DAY 07/21/15   Elveria Rising, NP  TEGRETOL 100 MG/5ML suspension TAKE 15 MLS BY MOUTH 3  TIMES A DAY 08/12/15   Elveria Risingina Goodpasture, NP   BP 127/91 mmHg  Pulse 92  Temp(Src) 98.2 F (36.8 C) (Oral)  Resp 18  Wt 125 lb (56.7 kg)  SpO2 95%   Physical Exam General: Well-developed, well-nourished male in no acute distress; appearance consistent with age of record HENT: normocephalic; atraumatic Eyes: pupils equal, round and reactive to light Neck: supple Heart: regular rate and rhythm Lungs: clear to auscultation bilaterally. Transmitted upper airway sounds. Abdomen: soft; nondistended; nontender; bowel sounds present. gastrotomy in left abdomen.  Extremities: Pulses normal;  contractures with muscular atrophy. Neurologic: Awake, alert; motor function intact in all extremities but exam limited due to contractures and MR Skin: Warm and dry  ED Course  Procedures (including critical care time) DIAGNOSTIC STUDIES: Oxygen Saturation is 95% on RA, adequate by my interpretation.    TEMPORARY GASTROSTOMY TUBE PLACEMENT  the patient's gastrostomy site was prepped with Betadine and surgically removed. A 16 French Foley catheter was placed into the ostomy using sterile technique. The balloon was inflated with 10 milliliters of normal saline. There was good flush and return of gastric contents. The patient tolerated this well and there were no immediate complications  MDM   Final diagnoses:  Complication of gastrostomy (HCC)   I personally performed the services described in this documentation, which was scribed in my presence. The recorded information has been reviewed and is accurate.    Paula LibraJohn Ladd Cen, MD 01/05/16 [phone]

## 2016-01-05 NOTE — ED Notes (Signed)
Father reports pt pulled out peg tube approximately 1030 pm.

## 2016-01-09 ENCOUNTER — Ambulatory Visit (HOSPITAL_COMMUNITY)
Admission: RE | Admit: 2016-01-09 | Discharge: 2016-01-09 | Disposition: A | Discharge status: Home / Self Care | Payer: Medicaid Other | Source: Ambulatory Visit | Attending: Gastroenterology | Admitting: Gastroenterology

## 2016-01-09 ENCOUNTER — Encounter (HOSPITAL_COMMUNITY)
Admission: RE | Disposition: A | Discharge status: Home / Self Care | Payer: Self-pay | Source: Ambulatory Visit | Attending: Gastroenterology

## 2016-01-09 DIAGNOSIS — G809 Cerebral palsy, unspecified: Secondary | ICD-10-CM | POA: Insufficient documentation

## 2016-01-09 DIAGNOSIS — Z431 Encounter for attention to gastrostomy: Secondary | ICD-10-CM | POA: Diagnosis present

## 2016-01-09 DIAGNOSIS — F79 Unspecified intellectual disabilities: Secondary | ICD-10-CM | POA: Insufficient documentation

## 2016-01-09 HISTORY — PX: BUTTON CHANGE: SHX5423

## 2016-01-09 SURGERY — REPLACEMENT, BUTTON GASTROSTOMY DEVICE

## 2016-01-09 NOTE — H&P (Signed)
    [facility] Consult Note  Referring Provider: No ref. provider found Primary Care Physician:  Irena ReichmannOLLINS, DANA, DO Primary Gastroenterologist:  Dr.  Antony Contrashief Complaint: Replace PEG tube HPI: Wayne Meyers is an 25 y.o. white male  with cerebral palsy dependent on tube feedings to have his gastrostomy tube coming out on May 29. There was apparently a 20 [place]French tube placed in 2013. He was seen in the emergency room and it was replaced with a 16 French Foley catheter.  Past Medical History  Diagnosis Date  . Mental retardation   . Cerebral palsy (HCC)   . Esophageal reflux   . Seizure (HCC)   . Vision abnormalities     Past Surgical History  Procedure Laterality Date  . Peg placement  12/07/2011    Procedure: PERCUTANEOUS ENDOSCOPIC GASTROSTOMY (PEG) REPLACEMENT;  Surgeon: Graylin ShiverSalem F Ganem, MD;  Location: [facility] ENDOSCOPY;  Service: Endoscopy;  Laterality: N/A;  20 french 4.4 cent.  . Nissen fundoplication  1986  . Peg placement N/A 01/04/2013    Procedure: PERCUTANEOUS ENDOSCOPIC GASTROSTOMY (PEG) REPLACEMENT;  Surgeon: Barrie FolkJohn C Holten Spano, MD;  [facility];  Service: Endoscopy;  Laterality: N/A;  Tomma Lightning. Button change N/A 04/05/2014    Procedure: BUTTON CHANGE;  Surgeon: Vertell NovakJames L Edwards Jr., MD;  Location: [facility] ENDOSCOPY;  Service: Endoscopy;  Laterality: N/A;  . Circumcision  1990  . Peg placement N/A 03/19/2015    Procedure: PERCUTANEOUS ENDOSCOPIC GASTROSTOMY (PEG) REPLACEMENT;  Surgeon: Charlott RakesVincent Schooler, MD;  [facility];  Service: Endoscopy;  Laterality: N/A;    No prescriptions prior to admission    Allergies:  Allergies  Allergen Reactions  . Augmentin [Amoxicillin-Pot Clavulanate] Diarrhea  . Powders [Talc] Rash    From powdered gloves    Family History  Problem Relation Age of Onset  . Seizures Mother   . Migraines Mother   . Seizures Brother   . Other Paternal Grandfather     Died in his 2070's from multiple health problems    Social History:   reports that he has never smoked. He has never used smokeless tobacco. He reports that he does not drink alcohol or use illicit drugs.  Review of Systems: negative except As above   There were no vitals taken for this visit. Head: Normocephalic, without obvious abnormality, atraumatic Neck: no adenopathy, no carotid bruit, no JVD, supple, symmetrical, trachea midline and thyroid not enlarged, symmetric, no tenderness/mass/nodules Resp: clear to auscultation bilaterally Cardio: regular rate and rhythm, S1, S2 normal, no murmur, click, rub or gallop GI: Abdomen soft. PEG tube Extremities: extremities normal, atraumatic, no cyanosis or edema  No results found for this or any previous visit (from the past 48 hour(s)). No results found.  Assessment: Dislodged PEG tube, temporarily replaced with Foley catheter. Plan:  We will insert a replacement PEG tubes now. Amado Andal C 01/09/2016, 8:25 AM  Pager [phone] If no answer or after 5 PM call [phone]

## 2016-01-09 NOTE — Op Note (Signed)
[facility] Patient Name: Wayne Meyers Procedure Date: 01/09/2016 MRN: [id number] Attending MD: Barrie Folk , MD Date of Birth: Aug 23, 1990 CSN: [id number] Age: 25 Admit Type: Outpatient Procedure:                Non-endoscopic Tube Procedure Indications:              Change PEG to button Providers:                Everardo All. Madilyn Fireman, MD Referring MD:              Medicines:                None Complications:            No immediate complications. Estimated Blood Loss:     Estimated blood loss: none. Procedure:                Pre-Anesthesia Assessment:                           - Prior to the procedure, a History and Physical                            was performed, and patient medications and                            allergies were reviewed. The patient's tolerance of                            previous anesthesia was also reviewed. The risks                            and benefits of the procedure and the sedation                            options and risks were discussed with the patient.                            All questions were answered, and informed consent                            was obtained. Prior Anticoagulants: The patient has                            taken no previous anticoagulant or antiplatelet                            agents. ASA Grade Assessment: III - A patient with                            severe systemic disease. After reviewing the risks                            and benefits, the patient was deemed in  satisfactory condition to undergo the procedure.                           After obtaining informed consent, the site was                            prepped and the procedure was performed. The                            procedure was accomplished without difficulty. The                            patient tolerated the procedure well. Findings:      Upon external examination, normal-appearing skin was found  surrounding       the stomal opening. The existing gastrostomy tube has been completely       dislodged. The gastrostomy tube required removal. The existing PEG site       was cleaned. The existing PEG balloon was deflated and by using       traction, removal was easily accomplished. A Mini-one 20 french       gastrostomy tube was lubricated and placed into the existing gastrostomy       port. A total of 10 mL saline was used to distend the balloon that was       previously tested. When positioned, the skin marking was noted to be 2.5       cm at the external bumper. The final tension and compression of the       abdominal wall by the gastrostomy tube and external bumper were checked       and revealed that the bumper was moderately tight and mildly deforming       the skin. Placement into the stomach was confirmed with flushing,       aspiration and auscultation. The tube was capped, and the tube site was       cleaned and dressed. Impression:               - The gastrostomy tube was removed and replaced                            with a Mini-one 20 french gastrostomy tube.                           - No specimens collected. Recommendation:           - Observe patient's clinical course. Procedure Code(s):        --- Professional ---                           [phone], Change of gastrostomy tube, percutaneous,                            without imaging or endoscopic guidance Diagnosis Code(s):        --- Professional ---                           Z43.1, Encounter for attention to gastrostomy CPT copyright 2016 American Medical Association. All rights reserved. The  codes documented in this report are preliminary and upon coder review may  be revised to meet current compliance requirements. Barrie FolkJohn C Hannibal Skalla, MD 01/09/2016 9:21:21 AM This report has been signed electronically. Number of Addenda: 0

## 2016-01-13 ENCOUNTER — Encounter (HOSPITAL_COMMUNITY): Payer: Self-pay | Admitting: Gastroenterology

## 2016-01-31 ENCOUNTER — Other Ambulatory Visit: Payer: Self-pay | Admitting: Family

## 2016-03-06 ENCOUNTER — Other Ambulatory Visit: Payer: Self-pay | Admitting: Family

## 2016-03-09 ENCOUNTER — Other Ambulatory Visit: Payer: Self-pay | Admitting: Family

## 2016-04-05 ENCOUNTER — Other Ambulatory Visit: Payer: Self-pay | Admitting: Pediatrics

## 2016-04-05 NOTE — Telephone Encounter (Signed)
Faxed to pharmacy

## 2016-04-08 ENCOUNTER — Other Ambulatory Visit: Payer: Self-pay | Admitting: Family

## 2016-04-08 DIAGNOSIS — G40309 Generalized idiopathic epilepsy and epileptic syndromes, not intractable, without status epilepticus: Secondary | ICD-10-CM

## 2016-05-18 DIAGNOSIS — G808 Other cerebral palsy: Secondary | ICD-10-CM | POA: Insufficient documentation

## 2016-05-18 DIAGNOSIS — G809 Cerebral palsy, unspecified: Secondary | ICD-10-CM | POA: Insufficient documentation

## 2016-08-23 ENCOUNTER — Other Ambulatory Visit: Payer: Self-pay | Admitting: Family

## 2016-08-27 ENCOUNTER — Other Ambulatory Visit: Payer: Self-pay | Admitting: Family

## 2016-08-30 ENCOUNTER — Telehealth (INDEPENDENT_AMBULATORY_CARE_PROVIDER_SITE_OTHER): Payer: Self-pay | Admitting: Pediatrics

## 2016-08-30 NOTE — Telephone Encounter (Signed)
LVM to CB to schedule appt.

## 2016-08-30 NOTE — Telephone Encounter (Signed)
-----   Message from Tina Goodpasture, NP sent at 08/23/2016 10:33 AM EST ----- °Regarding: Needs appointment °Wayne Meyers needs an appointment with me, preferably on a day that Dr Hickling is in the office.  °Thanks,  °Tina °

## 2016-08-30 NOTE — Telephone Encounter (Signed)
-----   Message from Elveria Risingina Goodpasture, NP sent at 08/23/2016 10:33 AM EST ----- Regarding: Needs appointment Greig CastillaAndrew needs an appointment with me, preferably on a day that Dr Sharene SkeansHickling is in the office.  Thanks,  Inetta Fermoina

## 2016-08-30 NOTE — Telephone Encounter (Signed)
Schedule appt for September 13, 2016 at 9:15am

## 2016-08-31 ENCOUNTER — Other Ambulatory Visit: Payer: Self-pay | Admitting: Pediatrics

## 2016-08-31 DIAGNOSIS — G40309 Generalized idiopathic epilepsy and epileptic syndromes, not intractable, without status epilepticus: Secondary | ICD-10-CM

## 2016-09-09 ENCOUNTER — Encounter (INDEPENDENT_AMBULATORY_CARE_PROVIDER_SITE_OTHER): Payer: Self-pay | Admitting: Family

## 2016-09-09 NOTE — Progress Notes (Signed)
Patient: Wayne Meyers MRN: [id number] Sex: male DOB: [birth year]  Provider: Elveria Rising, NP Location of Care: [facility]  Note type: Routine return visit  History of Present Illness: Referral Source: Irena Reichmann, MD History from: father and [facility] chart Chief Complaint: Generalized convulsive epilepsy  Wayne Meyers is a 26 y.o. 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 [name] was last seen July 21, 2015.  [name] is cared for at home by his father and [employer] workers. He was approved for assistance 8 hours per day, 7 days per week, along with respite care. Dad says that taking Wayne Meyers out of the home is difficult as his home does not have a wheelchair ramp and that there are steps that must be navigated in order for Hisashi to leave or reenter the home. Dad also asked about getting larger washable incontinence pads to help with keeping Wayne Meyers dry and positioning him in bed.   [name] has been generally healthy since he was last seen. He pulled out this gastrostomy tube and had replacement of that several months ago. Dad says that Wayne Meyers generally sleeps well. He has some nights in which he remains awake but in general he does fairly well. When he remains awake at night, he will then sleep during the day and Dad has to work to get him back on schedule.   [name] is taking and tolerating Depakene, Tegretol and Primidone for his seizure disorder. He has remained seizure free on this regimen. Goldman takes Robinul for excessive oral secretions.    Wayne Meyers's father has no other health concerns for him today other than previously mentioned.  Review of Systems: Please see the HPI for  neurologic and other pertinent review of systems. Otherwise, the following systems are noncontributory including constitutional, eyes, ears, nose and throat, cardiovascular, respiratory, gastrointestinal, genitourinary, musculoskeletal, skin, endocrine, hematologic/lymph, allergic/immunologic and psychiatric.   Past Medical History:  Diagnosis Date  . Cerebral palsy (HCC)   . Esophageal reflux   . Mental retardation   . Seizure (HCC)   . Vision abnormalities    Hospitalizations: No., Head Injury: No., Nervous System Infections: No., Immunizations up to date: Yes.   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  EEG November 23, 2006 10 - 11 Hz dominant posterior rhythm that was normal with generalized slowing right posterior region greater than left in bursts of independent bilateral electrographic seizure activity for 30 seconds in duration of 2-3 Hz spike and slow-wave discharges equally divided between the left and right hemispheres. There is also a right temporal focus of generalized to both hemispheres. Clinical behaviors were not seen.   Scratched left eye in September of 2015, healed without difficulty.  Fracture of left lower leg in 2016, healed without difficulty   Surgical History Past Surgical History:  Procedure Laterality Date  . BUTTON CHANGE N/A 04/05/2014   Procedure: BUTTON CHANGE;  Surgeon: Vertell Novak., MD;  Location: [facility];  Service: Endoscopy;  Laterality: N/A;  . BUTTON CHANGE N/A 01/09/2016   Procedure: BUTTON CHANGE;  Surgeon: Dorena Cookey, MD;  Location: [facility] ENDOSCOPY;  Service: Endoscopy;  Laterality: N/A;  may need sav dilators  . CIRCUMCISION  1990  . NISSEN FUNDOPLICATION  1986  . PEG PLACEMENT  12/07/2011   Procedure: PERCUTANEOUS ENDOSCOPIC GASTROSTOMY (PEG) REPLACEMENT;  Surgeon: Graylin Shiver, MD;  Location: [facility] ENDOSCOPY;  Service: Endoscopy;  Laterality: N/A;  20 french 4.4 cent.  Marland Kitchen  PEG PLACEMENT N/A 01/04/2013   Procedure: PERCUTANEOUS ENDOSCOPIC GASTROSTOMY (PEG) REPLACEMENT;  Surgeon: Barrie Folk, MD;  Location[facility] ENDOSCOPY;  Service: Endoscopy;  Laterality: N/A;  . PEG PLACEMENT N/A 03/19/2015   Procedure: PERCUTANEOUS ENDOSCOPIC GASTROSTOMY (PEG) REPLACEMENT;  Surgeon: Charlott Rakes, MD;  Location: [facility] ENDOSCOPY;  Service: Endoscopy;  Laterality: N/A;    Family History family history includes Migraines in his mother; Other in his paternal grandfather; Seizures in his brother and mother. Family History is otherwise negative for migraines, seizures, cognitive impairment, blindness, deafness, birth defects, chromosomal disorder, autism.  Social History Social History   Social History  . Marital status: Single    Spouse name: N/A  . Number of children: N/A  . Years of education: N/A   Social History Main Topics  . Smoking status: Never Smoker  . Smokeless tobacco: Never Used  . Alcohol use No  . Drug use: No  . Sexual activity: No   Other Topics Concern  . None   Social History Narrative   Wayne Meyers is not currently enrolled in a day program or school. He stays home and has a CNA helping with care. Dad has been having difficulty with continuous care for Justinn since October 2016. Dad reports to no longer being with patient's mother since the beginning of 2015, mother moved to [place]. Wayne Meyers enjoys playing with toys. He lives with his father and brothers.      Allergies Allergies  Allergen Reactions  . Augmentin [Amoxicillin-Pot Clavulanate] Diarrhea  . Powders [Talc] Rash    From powdered gloves    Physical Exam BP 90/60   Pulse 84  General: Awake but displays no interest in surroundings, well developed, well nourished, in no acute distress, right-handed, sandy hair, blue eyes Head: mildly microcephalic, no dysmorphic features; his hair is eroded from rubbing the back of his head Ears, Nose and Throat: Otoscopic: tympanic membranes normal .  Pharynx: oropharynx is pink without exudates or tonsillar hypertrophy. Neck: supple, full range of motion, no cranial or cervical bruits Respiratory: auscultation clear Cardiovascular: no murmurs, pulses are normal Musculoskeletal: The patient has left convex thoracolumbar scoliosis. He has tight shoulders, bilateral elbow contractures left greater than right, contractures of the knees were equal; contractures at the wrists left greater than right, clawhand deformity and left, greater ankle excursion on the left and the right but neither are particularly tight; he has contractures at both knees, equinus deformities of both ankles, Subluxation of the left hip with good internal/external rotation on the right. Bony prominence on the external aspect of his left foot and some form of cyst along the plantar muscles of the left foot. Skin: no rashes or neurocutaneous lesions Trunk: He has a percutaneous gastrostomy in the left epigastric region  Neurologic Exam  Mental Status: severe retardation, no language, he takes no note of the examiner except when touched. He dozed for a short time while I talked with his father. Cranial Nerves: The patient is cortically blind. He has reactive pupils. Positive red reflex, photophobia, he drools. There is lower facial weakness. He does not move his tongue well. It protrudes slightly to the right He will orient his head in the direction of sounds. Motor: Spastic quadriparesis with some sparing of the right arm which moves better  than the left. Left hand is tightly fisted. He is slumped to the right side of his chair. Sensory: withdrawal x4, right better than left  Coordination: cannot test Gait and Station: Unable to stand or bear weight, uses wheelchair for mobility, head tends to droop to his chest Reflexes: symmetric and brisk bilaterally; no clonus; bilateral extensor plantar responses.  Impression 1. 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 2. Generalized convulsive epilepsy, G40.309. 3. Generalized non-convulsive epilepsy, G40.309. 4. Myoclonus, G25.3. 5. 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 6. 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 7. Dysphagia oropharyngeal phase, R13.12   Recommendations for plan of care [facility]. Wayne Castillandrew has neither had nor required imaging or lab studies since the last visit other than for replacement of his gastrostomy tube in 2017. 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 He is taking and tolerating Depakene, Tegretol and Primidone for his seizure disorder and has remained seizure free on these medications. He will continue these medications without change for now. There is great difficulty taking Wayne Castillandrew out of the house as his home does not have a wheelchair ramp. I told Dad that [facility] will [facility]. I will also investigate updating his incontinence supplies to include large washable pads to help keep Sherry dry and facilitate positioning.  I will otherwise see Wayne Castillandrew back in follow up in 1 year or sooner if needed.   The medication list was reviewed and reconciled.  No changes were made in the prescribed medications today.  A complete medication list was provided to the patient/caregiver.  Allergies as of 09/13/2016      Reactions   Augmentin [amoxicillin-pot Clavulanate] Diarrhea   Amoxicillin    Other reaction(s): Other (See Comments) unknown   Powders [talc] Rash   From powdered gloves      Medication List       Accurate as of 09/13/16   3:02 PM. Always use your most recent med list.          albuterol (2.5 MG/3ML) 0.083% nebulizer solution Commonly known as:  PROVENTIL Take 2.5 mg by nebulization every 6 (six) hours as needed for wheezing.   DEPAKENE 250 MG/5ML Soln solution Generic drug:  Valproate Sodium TAKE 15 MLS BY G-TUBE 3 TIMES PER DAY   glycopyrrolate 1 MG tablet Commonly known as:  ROBINUL Take 1 tablet (1 mg total) by mouth 2 (two) times daily.   primidone 250 MG tablet Commonly known as:  MYSOLINE TAKE 1/2 TABLET BY MOUTH 3 TIMES A DAY   PULMICORT 0.5 MG/2ML nebulizer solution Generic drug:  budesonide Take 0.5 mg by nebulization 2 (two) times daily.   TEGRETOL 100 MG/5ML suspension Generic drug:  carBAMazepine TAKE 15 MLS BY MOUTH 3 TIMES A DAY       Dr. Sharene SkeansHickling was consulted regarding the patient.   Total time spent with the patient was 30 minutes, of which 50% or more was spent in counseling and coordination of care.   Elveria Risingina Shaquille Janes NP-C

## 2016-09-13 ENCOUNTER — Ambulatory Visit (INDEPENDENT_AMBULATORY_CARE_PROVIDER_SITE_OTHER): Payer: Medicaid Other | Admitting: Family

## 2016-09-13 ENCOUNTER — Encounter (INDEPENDENT_AMBULATORY_CARE_PROVIDER_SITE_OTHER): Payer: Self-pay | Admitting: Family

## 2016-09-13 VITALS — BP 90/60 | HR 84

## 2016-09-13 DIAGNOSIS — R1312 Dysphagia, oropharyngeal phase: Secondary | ICD-10-CM

## 2016-09-13 DIAGNOSIS — G40309 Generalized idiopathic epilepsy and epileptic syndromes, not intractable, without status epilepticus: Secondary | ICD-10-CM | POA: Diagnosis not present

## 2016-09-13 DIAGNOSIS — K117 Disturbances of salivary secretion: Secondary | ICD-10-CM | POA: Diagnosis not present

## 2016-09-13 DIAGNOSIS — G253 Myoclonus: Secondary | ICD-10-CM

## 2016-09-13 DIAGNOSIS — M4147 Neuromuscular scoliosis, lumbosacral region: Secondary | ICD-10-CM

## 2016-09-13 DIAGNOSIS — H47619 Cortical blindness, unspecified side of brain: Secondary | ICD-10-CM

## 2016-09-13 DIAGNOSIS — G808 Other cerebral palsy: Secondary | ICD-10-CM

## 2016-09-13 MED ORDER — PRIMIDONE 250 MG PO TABS: ORAL_TABLET | ORAL | 5 refills | Status: DC

## 2016-09-13 MED ORDER — TEGRETOL 100 MG/5ML PO SUSP: ORAL | 5 refills | Status: DC

## 2016-09-13 MED ORDER — DEPAKENE 250 MG/5ML PO SOLN: ORAL | 5 refills | Status: DC

## 2016-09-13 MED ORDER — GLYCOPYRROLATE 1 MG PO TABS: 1.0000 mg | ORAL_TABLET | Freq: Two times a day (BID) | ORAL | 5 refills | Status: DC

## 2016-09-13 NOTE — Patient Instructions (Signed)
Continue giving Wayne Meyers his medications as you have been giving them.   I will contact [facility] about the ramp and about the incontinence pad, and let you know what I learn.   Please plan to return for follow up in 1 year or sooner if needed.

## 2016-09-21 ENCOUNTER — Ambulatory Visit (HOSPITAL_COMMUNITY)
Admission: RE | Admit: 2016-09-21 | Discharge: 2016-09-21 | Disposition: A | Discharge status: Home / Self Care | Payer: Medicaid Other | Source: Ambulatory Visit | Attending: Gastroenterology | Admitting: Gastroenterology

## 2016-09-21 ENCOUNTER — Encounter (HOSPITAL_COMMUNITY)
Admission: RE | Disposition: A | Discharge status: Home / Self Care | Payer: Self-pay | Source: Ambulatory Visit | Attending: Gastroenterology

## 2016-09-21 ENCOUNTER — Encounter (HOSPITAL_COMMUNITY): Payer: Self-pay | Admitting: Gastroenterology

## 2016-09-21 DIAGNOSIS — K9429 Other complications of gastrostomy: Secondary | ICD-10-CM | POA: Diagnosis not present

## 2016-09-21 DIAGNOSIS — F79 Unspecified intellectual disabilities: Secondary | ICD-10-CM | POA: Diagnosis not present

## 2016-09-21 DIAGNOSIS — Z79899 Other long term (current) drug therapy: Secondary | ICD-10-CM | POA: Insufficient documentation

## 2016-09-21 DIAGNOSIS — G808 Other cerebral palsy: Secondary | ICD-10-CM | POA: Diagnosis not present

## 2016-09-21 HISTORY — PX: PEG PLACEMENT: SHX5437

## 2016-09-21 SURGERY — REPLACEMENT, PEG TUBE, WITHOUT ENDOSCOPY

## 2016-09-21 NOTE — Procedures (Signed)
PEG tube replacement. Patient has had a long-term PEG button. The tube is no longer functional. It was replaced in the usual manner with PEG button 20 French 4.4 cm length. The balloon was insulated with 10 mL of sterile saline. The father was instructed to give us a call if there is any further problems

## 2016-09-21 NOTE — H&P (Signed)
Subjective:   Patient is a 26 y.o. male presents with Long-term use of PEG button which is no longer functioning and needs to be replaced.. Procedure including risks and benefits discussed in office.  Patient Active Problem List   Diagnosis Date Noted  . 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 Myoclonus 04/11/2013  . Congenital quadriplegia (HCC) 04/11/2013  . Dysphagia, oropharyngeal phase 04/11/2013  . Neuromuscular scoliosis of lumbosacral region 04/11/2013  . 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 Cerebral palsy (HCC)   . Esophageal reflux   . Mental retardation   . Seizure (HCC)   . Vision abnormalities     Past Surgical History:  Procedure Laterality Date  . BUTTON CHANGE N/A 04/05/2014   Procedure: BUTTON CHANGE;  Surgeon: Vertell Novak., MD;  Location: [facility];  Service: Endoscopy;  Laterality: N/A;  . BUTTON CHANGE N/A 01/09/2016   Procedure: BUTTON CHANGE;  Surgeon: Dorena Cookey, MD;  Location: [facility];  Service: Endoscopy;  Laterality: N/A;  may need sav dilators  . CIRCUMCISION  1990  . NISSEN FUNDOPLICATION  1986  . PEG PLACEMENT  12/07/2011   Procedure: PERCUTANEOUS ENDOSCOPIC GASTROSTOMY (PEG) REPLACEMENT;  Surgeon: Graylin Shiver, MD;  Location: [facility];  Service: Endoscopy;  Laterality: N/A;  20 french 4.4 cent.  Marland Kitchen PEG PLACEMENT N/A 01/04/2013   Procedure: PERCUTANEOUS ENDOSCOPIC GASTROSTOMY (PEG) REPLACEMENT;  Surgeon: Barrie Folk, MD;  Location[facility];  Service: Endoscopy;  Laterality: N/A;  . PEG PLACEMENT N/A 03/19/2015   Procedure: PERCUTANEOUS ENDOSCOPIC GASTROSTOMY (PEG) REPLACEMENT;  Surgeon: Charlott Rakes, MD;  Location: [facility] ENDOSCOPY;  Service: Endoscopy;  Laterality: N/A;    Prescriptions Prior to Admission  Medication Sig Dispense Refill Last Dose  . albuterol (PROVENTIL) (2.5 MG/3ML)  0.083% nebulizer solution Take 2.5 mg by nebulization every 6 (six) hours as needed for wheezing.   Taking  . budesonide (PULMICORT) 0.5 MG/2ML nebulizer solution Take 0.5 mg by nebulization 2 (two) times daily as needed (shortness of breath).    Taking  . chlorpheniramine (CHLOR-TRIMETON) 4 MG tablet Place 2 mg into feeding tube 2 (two) times daily.     Marland Kitchen DEPAKENE 250 MG/5ML SOLN solution TAKE 15 MLS BY G-TUBE 3 TIMES PER DAY 1530 mL 5   . diphenhydrAMINE (BENYLIN) 12.5 MG/5ML syrup Place 37.5 mg into feeding tube at bedtime.     Marland Kitchen glycopyrrolate (ROBINUL) 1 MG tablet Take 1 tablet (1 mg total) by mouth 2 (two) times daily. (Patient taking differently: Place 1 mg into feeding tube 2 (two) times daily. ) 62 tablet 5   . Nutritional Supplements (FEEDING SUPPLEMENT, JEVITY 1.5 CAL,) LIQD Place 237 mLs into feeding tube 4 (four) times daily.     . primidone (MYSOLINE) 250 MG tablet TAKE 1/2 TABLET BY MOUTH 3 TIMES A DAY (Patient taking differently: TAKE 1/2 TABLET BY G-TUBE 3 TIMES A DAY) 50 tablet 5   . TEGRETOL 100 MG/5ML suspension TAKE 15 MLS BY MOUTH 3 TIMES A DAY (Patient taking differently: TAKE 15 MLS BY G-TUBE  3 TIMES A DAY) 1400 mL 5    Allergies  Allergen Reactions  . Augmentin [Amoxicillin-Pot Clavulanate] Diarrhea    Has patient had a PCN reaction causing immediate rash, facial/tongue/throat swelling, SOB or lightheadedness with hypotension: Unknown Has patient had a PCN reaction causing severe rash involving mucus membranes or skin necrosis: Unknown Has patient had  a PCN reaction that required hospitalization Unknown Has patient had a PCN reaction occurring within the last 10 years: No If all of the above answers are "NO", then may proceed with Cephalosporin use.   Marland Kitchen. Amoxicillin     Childhood allergy  . Powders [Talc] Rash    From powdered gloves    Social History  Substance Use Topics  . Smoking status: Never Smoker  . Smokeless tobacco: Never Used  . Alcohol use No     Family History  Problem Relation Age of Onset  . Seizures Mother   . Migraines Mother   . Seizures Brother   . Other Paternal Grandfather     Died in his 7370's from multiple health problems     Objective:   No data found.  No intake/output data recorded. No intake/output data recorded.   See MD Preop evaluation      Assessment:   Nonfunctioning PEG button  Plan:   Will plan on replacing PEG button. Will discuss with patient and his caregiver.

## 2016-09-21 NOTE — Progress Notes (Signed)
Peg tube replaced, patient was accompianed by his father. A timeout was completed before the peg button was removed. A new peg button was placed in existing tract without difficulty.

## 2016-09-24 ENCOUNTER — Encounter (HOSPITAL_COMMUNITY): Payer: Self-pay | Admitting: Gastroenterology

## 2016-10-30 ENCOUNTER — Other Ambulatory Visit (INDEPENDENT_AMBULATORY_CARE_PROVIDER_SITE_OTHER): Payer: Self-pay | Admitting: Family

## 2016-10-30 DIAGNOSIS — G40309 Generalized idiopathic epilepsy and epileptic syndromes, not intractable, without status epilepticus: Secondary | ICD-10-CM

## 2017-03-30 ENCOUNTER — Other Ambulatory Visit (INDEPENDENT_AMBULATORY_CARE_PROVIDER_SITE_OTHER): Payer: Self-pay | Admitting: Family

## 2017-03-30 DIAGNOSIS — R1312 Dysphagia, oropharyngeal phase: Secondary | ICD-10-CM

## 2017-03-30 DIAGNOSIS — G808 Other cerebral palsy: Secondary | ICD-10-CM

## 2017-03-30 DIAGNOSIS — K117 Disturbances of salivary secretion: Secondary | ICD-10-CM

## 2017-03-31 ENCOUNTER — Other Ambulatory Visit (INDEPENDENT_AMBULATORY_CARE_PROVIDER_SITE_OTHER): Payer: Self-pay | Admitting: Family

## 2017-03-31 DIAGNOSIS — G40309 Generalized idiopathic epilepsy and epileptic syndromes, not intractable, without status epilepticus: Secondary | ICD-10-CM

## 2017-04-13 ENCOUNTER — Other Ambulatory Visit: Payer: Self-pay | Admitting: Gastroenterology

## 2017-04-14 ENCOUNTER — Encounter (HOSPITAL_COMMUNITY): Payer: Self-pay | Admitting: Gastroenterology

## 2017-04-14 ENCOUNTER — Ambulatory Visit (HOSPITAL_COMMUNITY)
Admission: RE | Admit: 2017-04-14 | Discharge: 2017-04-14 | Disposition: A | Discharge status: Home / Self Care | Payer: Medicaid Other | Source: Ambulatory Visit | Attending: Gastroenterology | Admitting: Gastroenterology

## 2017-04-14 ENCOUNTER — Encounter (HOSPITAL_COMMUNITY)
Admission: RE | Disposition: A | Discharge status: Home / Self Care | Payer: Self-pay | Source: Ambulatory Visit | Attending: Gastroenterology

## 2017-04-14 DIAGNOSIS — Z431 Encounter for attention to gastrostomy: Secondary | ICD-10-CM | POA: Insufficient documentation

## 2017-04-14 HISTORY — PX: BUTTON CHANGE: SHX5423

## 2017-04-14 SURGERY — REPLACEMENT, BUTTON GASTROSTOMY DEVICE

## 2017-04-14 NOTE — H&P (Signed)
Patient at bedside for PEG exchange.  A button PEG 20 [place]French into 4.4 cm was exchanged with a new same sized PEG( because of malfunction-leakage at the site of button).  The PEG size was not upgraded, as there was no leakage from around the PEG. Patient tolerated the procedure well. It was uneventful.   Patient to follow up with Dr. Bosie ClosSchooler as previously scheduled. Kerin SalenArya Elaine Roanhorse, M.D. [phone]

## 2017-04-15 ENCOUNTER — Encounter (HOSPITAL_COMMUNITY): Payer: Self-pay | Admitting: Gastroenterology

## 2017-04-18 ENCOUNTER — Other Ambulatory Visit (INDEPENDENT_AMBULATORY_CARE_PROVIDER_SITE_OTHER): Payer: Self-pay | Admitting: Family

## 2017-04-18 DIAGNOSIS — G40309 Generalized idiopathic epilepsy and epileptic syndromes, not intractable, without status epilepticus: Secondary | ICD-10-CM

## 2017-05-13 ENCOUNTER — Other Ambulatory Visit (INDEPENDENT_AMBULATORY_CARE_PROVIDER_SITE_OTHER): Payer: Self-pay | Admitting: Family

## 2017-05-13 DIAGNOSIS — G40309 Generalized idiopathic epilepsy and epileptic syndromes, not intractable, without status epilepticus: Secondary | ICD-10-CM

## 2017-09-21 ENCOUNTER — Other Ambulatory Visit (INDEPENDENT_AMBULATORY_CARE_PROVIDER_SITE_OTHER): Payer: Self-pay | Admitting: Family

## 2017-09-21 DIAGNOSIS — R1312 Dysphagia, oropharyngeal phase: Secondary | ICD-10-CM

## 2017-09-21 DIAGNOSIS — K117 Disturbances of salivary secretion: Secondary | ICD-10-CM

## 2017-09-21 DIAGNOSIS — G40309 Generalized idiopathic epilepsy and epileptic syndromes, not intractable, without status epilepticus: Secondary | ICD-10-CM

## 2017-09-21 DIAGNOSIS — G808 Other cerebral palsy: Secondary | ICD-10-CM

## 2017-09-26 ENCOUNTER — Telehealth (INDEPENDENT_AMBULATORY_CARE_PROVIDER_SITE_OTHER): Payer: Self-pay | Admitting: Family

## 2017-09-26 DIAGNOSIS — G40309 Generalized idiopathic epilepsy and epileptic syndromes, not intractable, without status epilepticus: Secondary | ICD-10-CM

## 2017-09-26 MED ORDER — DEPAKENE 250 MG/5ML PO SOLN: ORAL | 5 refills | Status: DC

## 2017-09-26 MED ORDER — VALPROATE SODIUM 250 MG/5ML PO SOLN: ORAL | 5 refills | Status: DC

## 2017-09-26 NOTE — Telephone Encounter (Signed)
Tiffanie could you check with the pharmacy and see if they can get the generic Depakone liquid? If so, it is ok to change him to generic while they are out of brand. If they can't get any liquid, please let me know. Thanks, Inetta Fermoina

## 2017-09-26 NOTE — Telephone Encounter (Signed)
I sent in the Rx. Thanks, Inetta Fermoina

## 2017-09-26 NOTE — Telephone Encounter (Signed)
Who's calling (name and relationship to patient) : Ferraiolo,Gregory B (Father) Best contact number: [phone] ([phone]) Provider they see: Elveria Risingina Goodpasture, NP Reason for call: Father calling to get a refill for patients "LIQUID" medication. Father is requesting a call once the prescription is ready. Father also stated that the pharmacy is out of the medication as well.      PRESCRIPTION REFILL ONLY  Name of prescription: DEPAKENE 250MG   [facility] [place]

## 2017-09-26 NOTE — Telephone Encounter (Signed)
[facility] [place] She stated that they do have the generic brand in liquid form. We just need to send over the generic brand prescription so they can fill it.

## 2017-09-26 NOTE — Telephone Encounter (Signed)
Spoke with dad to see what was going on with the pharmacy. He stated that [facility] are out of the Depakene solution. Dad is requesting a temporary fix until they get the liquid in. Please advise

## 2017-10-24 MED ORDER — VALPROATE SODIUM 250 MG/5ML PO SOLN
ORAL | 5 refills | Status: DC
Start: 2017-10-24 — End: 2017-10-29

## 2017-10-24 NOTE — Telephone Encounter (Signed)
I received a fax from the pharmacy today that Depakene is unavailable. I had already changed him to generic Valproate in February. I sent in a new Rx with request to remove Depakene from his profile. TG

## 2017-10-24 NOTE — Addendum Note (Signed)
Addended by: Princella IonGOODPASTURE, Philena Obey P on: 10/24/2017 10:20 AM   Modules accepted: Orders

## 2017-10-26 ENCOUNTER — Encounter (INDEPENDENT_AMBULATORY_CARE_PROVIDER_SITE_OTHER): Payer: Self-pay | Admitting: Family

## 2017-10-26 ENCOUNTER — Ambulatory Visit (INDEPENDENT_AMBULATORY_CARE_PROVIDER_SITE_OTHER): Payer: Medicaid Other | Admitting: Family

## 2017-10-26 VITALS — BP 120/80 | HR 76

## 2017-10-26 DIAGNOSIS — G808 Other cerebral palsy: Secondary | ICD-10-CM | POA: Diagnosis not present

## 2017-10-26 DIAGNOSIS — R1312 Dysphagia, oropharyngeal phase: Secondary | ICD-10-CM

## 2017-10-26 DIAGNOSIS — G253 Myoclonus: Secondary | ICD-10-CM | POA: Diagnosis not present

## 2017-10-26 DIAGNOSIS — H47619 Cortical blindness, unspecified side of brain: Secondary | ICD-10-CM | POA: Diagnosis not present

## 2017-10-26 DIAGNOSIS — M4147 Neuromuscular scoliosis, lumbosacral region: Secondary | ICD-10-CM

## 2017-10-26 DIAGNOSIS — K117 Disturbances of salivary secretion: Secondary | ICD-10-CM | POA: Diagnosis not present

## 2017-10-26 DIAGNOSIS — G40309 Generalized idiopathic epilepsy and epileptic syndromes, not intractable, without status epilepticus: Secondary | ICD-10-CM

## 2017-10-26 NOTE — Progress Notes (Signed)
Patient: Wayne Meyers MRN: [id number] Sex: male DOB: [birth year]  Provider: Elveria Rising, NP Location of Care: [facility]  Note type: Routine return visit  History of Present Illness: Referral Source: Irena Reichmann, MD History from: father and [facility] chart Chief Complaint: Generalized convulsive epilepsy  [name] is a 27 y.o. 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 Paco was last seen September 13, 2016.  He is taking and tolerating Depakene, Tegretol and Primidone for his seizure disorder and has remained seizure free for years. Dad says that Wayne Meyers occasionally has staring spells but it is unclear if these events are seizures.   [name] is cared for at home by his father and [employer] workers. He receives 8 hours per day, 7 days per week along with respite care. Dad is satisfied with his care at this time.   Dad reports that Wayne Meyers has been generally healthy since he was last seen. He tolerates his g-tube feedings and generally sleeps well. Dad has no other health concerns for Wayne Meyers today other than previously mentioned.  Review of Systems: Please see the HPI for neurologic and other pertinent review of systems. Otherwise, all other systems were reviewed and were negative.    Past Medical History:  Diagnosis Date  . Cerebral palsy (HCC)   . Esophageal reflux   . Mental retardation   . Seizure (HCC)   . Vision abnormalities    Hospitalizations: No., Head Injury: No., Nervous System Infections: No., Immunizations up to date: Yes.   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  EEG November 23, 2006 10 - 11 Hz dominant  posterior rhythm that was normal with generalized slowing right posterior region greater than left in bursts of independent bilateral electrographic seizure activity for 30 seconds in duration of 2-3 Hz spike and slow-wave discharges equally divided between the left and right hemispheres. There is also a right temporal focus of generalized to both hemispheres. Clinical behaviors were not seen.   Scratched left eye in September of 2015, healed without difficulty.  Fracture of left lower leg in 2016, healed without difficulty  Surgical History Past Surgical History:  Procedure Laterality Date  . BUTTON CHANGE N/A 04/05/2014   Procedure: BUTTON CHANGE;  Surgeon: Vertell Novak., MD;  Location: [facility];  Service: Endoscopy;  Laterality: N/A;  . BUTTON CHANGE N/A 01/09/2016   Procedure: BUTTON CHANGE;  Surgeon: Dorena Cookey, MD;  Location: [facility];  Service: Endoscopy;  Laterality: N/A;  may need sav dilators  . BUTTON CHANGE N/A 04/14/2017   Procedure: BUTTON CHANGE;  Surgeon: Kerin Salen, MD;  Location: [facility];  Service: Gastroenterology;  Laterality: N/A;  . CIRCUMCISION  1990  . NISSEN FUNDOPLICATION  1986  . PEG PLACEMENT  12/07/2011   Procedure: PERCUTANEOUS ENDOSCOPIC GASTROSTOMY (PEG) REPLACEMENT;  Surgeon: Graylin Shiver, MD;  Location: [facility];  Service: Endoscopy;  Laterality: N/A;  20 french 4.4 cent.  Marland Kitchen PEG PLACEMENT N/A 01/04/2013   Procedure: PERCUTANEOUS ENDOSCOPIC GASTROSTOMY (PEG) REPLACEMENT;  Surgeon: Barrie Folk, MD;  Location: [facility] ENDOSCOPY;  Service: Endoscopy;  Laterality: N/A;  . PEG PLACEMENT N/A 03/19/2015   Procedure: PERCUTANEOUS ENDOSCOPIC GASTROSTOMY (PEG) REPLACEMENT;  Surgeon: Charlott Rakes, MD;  Location: [facility];  Service: Endoscopy;  Laterality: N/A;  . PEG PLACEMENT N/A 09/21/2016   Procedure: PERCUTANEOUS ENDOSCOPIC GASTROSTOMY (PEG) REPLACEMENT;  Surgeon: Carman ChingJames Edwards, MD;  Location: [facility];  Service: Endoscopy;  Laterality:  N/A;    Family History family history includes Migraines in his mother; Other in his paternal grandfather; Seizures in his brother and mother. Family History is otherwise negative for migraines, seizures, cognitive impairment, blindness, deafness, birth defects, chromosomal disorder, autism.  Social History Social History   Socioeconomic History  . Marital status: Single    Spouse name: None  . Number of children: None  . Years of education: None  . Highest education level: None  Social Needs  . Financial resource strain: None  . Food insecurity - worry: None  . Food insecurity - inability: None  . Transportation needs - medical: None  . Transportation needs - non-medical: None  Occupational History  . None  Tobacco Use  . Smoking status: Never Smoker  . Smokeless tobacco: Never Used  Substance and Sexual Activity  . Alcohol use: No  . Drug use: No  . Sexual activity: No  Other Topics Concern  . None  Social History Narrative   Wayne Meyers is not currently enrolled in a day program or school. He stays home and has a CNA helping with care. Dad has been having difficulty with continuous care for Wayne Meyers since October 2016. Dad reports to no longer being with patient's mother since the beginning of 2015, mother moved to [place]. Wayne Meyers enjoys playing with toys. He lives with his father and brothers.      Allergies Allergies  Allergen Reactions  . Augmentin [Amoxicillin-Pot Clavulanate] Diarrhea    Has patient had a PCN reaction causing immediate rash, facial/tongue/throat swelling, SOB or lightheadedness with hypotension: Unknown Has patient had a PCN reaction causing severe rash involving mucus membranes or skin necrosis: Unknown Has patient had a PCN reaction that required hospitalization Unknown Has patient had a PCN reaction occurring within the last 10 years: No If all of the above answers are "NO", then may proceed with Cephalosporin use.   Marland Kitchen. Amoxicillin     Childhood  allergy  . Powders [Talc] Rash    From powdered gloves    Physical Exam BP 120/80   Pulse 76  General: well developed, well nourished man, seated in wheelchair, in no evident distress; sandy hair, blue eyes, right handed Head: microcephalic and atraumatic. Oropharynx benign. No dysmorphic features. Neck: supple with no carotid bruits. Cardiovascular: regular rate and rhythm, no murmurs. Respiratory: Clear to auscultation bilaterally Abdomen: Bowel sounds present all four quadrants, abdomen soft, non-tender, non-distended. No hepatosplenomegaly or masses palpated.Gastrostomy tube in place. Musculoskeletal: He has left convex thoracolumbar scoliosis. He has tight shoulders, bilateral elbow contractures left greater than right, contractures of the wrists and knees. 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 He has subluxation of the left hip with good internal/external rotation on the right, and equinus deformities of both ankles. Skin: no rashes or neurocutaneous lesions  Neurologic Exam Mental Status: Drowsy but awakens easily for examination. Has no language. Takes little notice of the examiner.Resistant to invasions in to his space Cranial Nerves: Fundoscopic exam - red reflex present.  Unable to fully visualize fundus.  Pupils equal briskly reactive to light.  Does not turn to localize faces and objects in the periphery. Turns to localize sounds in the periphery. His tongue protrudes and is slightly deviated to the right.  Neck flexion and extension  abnormal with poor head control. He has lower facial weakness with excessive drooling. Motor: Spastic quadriparesis with contractures in his limbs and clawhand deformity. He sits slumped to the right in his chair and returns to that position after being repositioned. Sensory: Withdrawal x 4 Coordination: Unable to adequately assess due to patient's inability to participate in examination. Gait and Station: Unable to independently  stand and bear weight.  Reflexes: Diminished and symmetric. Toes neutral. No clonus  Impression 1.  Congenital quadriplegia, G80.8 2.  Generalized convulsive epilepsy, G40.309 3.  Generalized non-convulsive epilepsy, G40.309 4.  Myoclonus, G25.3 5.  Disorder of the visual cortex associated with cortical blindness, H47.619 6.  Neuromuscular scoliosis, M41.47 7.  Dysphagia, R13.12   Recommendations for plan of care The patient's previous [facility] records were reviewed. [place] has neither had nor required imaging or lab studies since the last visit. He is a 27 year old man with congenital quadriplegia, seizure disorder, myoclonus, cortical blindness, dysphagia and neuromuscular scoliosis. He is taking and tolerating Depakene, Tegretol and Primidone and has remained seizure free. He will continue his medications without change and will return for follow up in one year or sooner if needed.  The medication list was reviewed and reconciled.  No changes were made in the prescribed medications today.  A complete medication list was provided to the patient's father.  Allergies as of 10/26/2017      Reactions   Augmentin [amoxicillin-pot Clavulanate] Diarrhea   Has patient had a PCN reaction causing immediate rash, facial/tongue/throat swelling, SOB or lightheadedness with hypotension: Unknown Has patient had a PCN reaction causing severe rash involving mucus membranes or skin necrosis: Unknown Has patient had a PCN reaction that required hospitalization Unknown Has patient had a PCN reaction occurring within the last 10 years: No If all of the above answers are "NO", then may proceed with Cephalosporin use.   Amoxicillin    Childhood allergy   Powders [talc] Rash   From powdered gloves      Medication List        Accurate as of 10/26/17 11:59 PM. Always use your most recent med list.          albuterol (2.5 MG/3ML) 0.083% nebulizer solution Commonly known as:  PROVENTIL Take 2.5 mg by  nebulization every 6 (six) hours as needed for wheezing.   chlorpheniramine 4 MG tablet Commonly known as:  CHLOR-TRIMETON Place 10 mg into feeding tube daily as needed for allergies.   diphenhydrAMINE 12.5 MG/5ML syrup Commonly known as:  BENYLIN Place 37.5 mg into feeding tube at bedtime.   feeding supplement (JEVITY 1.5 CAL) Liqd Place 237 mLs into feeding tube 4 (four) times daily.   glycopyrrolate 1 MG tablet Commonly known as:  ROBINUL TAKE 1 TABLET BY MOUTH TWICE A DAY   primidone 250 MG tablet Commonly known as:  MYSOLINE TAKE 1/2 TABLET BY MOUTH 3 TIMES A DAY   PULMICORT 0.5 MG/2ML nebulizer solution Generic drug:  budesonide Take 0.5 mg by nebulization 2 (two) times daily as needed (shortness of breath).   sulfamethoxazole-trimethoprim 200-40 MG/5ML suspension Commonly known as:  BACTRIM,SEPTRA Take by mouth.   TEGRETOL 100 MG/5ML suspension Generic drug:  carBAMazepine TAKE BY MOUTH 3 TIMES DAILY   Valproate Sodium 250 MG/5ML Soln solution Commonly known as:  DEPAKENE Give 15 ml by g-tube 3 times per day       Total time spent with the patient was 20 minutes, of which 50% or more was spent in counseling  and coordination of care.   Elveria Rising NP-C

## 2017-10-27 NOTE — Patient Instructions (Signed)
Thank you for coming in today.   Instructions for you until your next appointment are as follows: 1. Continue Wayne Meyers's medications as you have been giving them 2. Let me know if he has seizures or if you have other concerns.  3. Please sign up for MyChart if you have not done so 4. Please plan to return for follow up in one year or sooner if needed.

## 2017-10-29 ENCOUNTER — Encounter (INDEPENDENT_AMBULATORY_CARE_PROVIDER_SITE_OTHER): Payer: Self-pay | Admitting: Family

## 2017-10-29 MED ORDER — PRIMIDONE 250 MG PO TABS: ORAL_TABLET | ORAL | 5 refills | Status: DC

## 2017-10-29 MED ORDER — TEGRETOL 100 MG/5ML PO SUSP: ORAL | 5 refills | Status: DC

## 2017-10-29 MED ORDER — GLYCOPYRROLATE 1 MG PO TABS: 1.0000 mg | ORAL_TABLET | Freq: Two times a day (BID) | ORAL | 5 refills | Status: DC

## 2017-10-29 MED ORDER — VALPROATE SODIUM 250 MG/5ML PO SOLN: ORAL | 5 refills | Status: DC

## 2018-03-14 ENCOUNTER — Other Ambulatory Visit (INDEPENDENT_AMBULATORY_CARE_PROVIDER_SITE_OTHER): Payer: Self-pay | Admitting: Family

## 2018-03-14 DIAGNOSIS — G40309 Generalized idiopathic epilepsy and epileptic syndromes, not intractable, without status epilepticus: Secondary | ICD-10-CM

## 2018-03-15 ENCOUNTER — Other Ambulatory Visit (INDEPENDENT_AMBULATORY_CARE_PROVIDER_SITE_OTHER): Payer: Self-pay | Admitting: Family

## 2018-03-15 DIAGNOSIS — G40309 Generalized idiopathic epilepsy and epileptic syndromes, not intractable, without status epilepticus: Secondary | ICD-10-CM

## 2018-03-16 ENCOUNTER — Telehealth (INDEPENDENT_AMBULATORY_CARE_PROVIDER_SITE_OTHER): Payer: Self-pay | Admitting: Family

## 2018-03-16 NOTE — Telephone Encounter (Signed)
New rx with brand name medically necessary written on it faxed to pharmacy.

## 2018-03-16 NOTE — Telephone Encounter (Signed)
°  Who's calling (name and relationship to patient) : Wayne Meyers (Father) Best contact number: [phone] Provider they see: Inetta Fermoina  Reason for call: Dad stated that rx for Tegretol must have "This must be name brand" hand written on the rx when sent to the pharmacy in order for medicaid to cover rx.      PRESCRIPTION REFILL ONLY  Name of prescription:  [facility] [place] [address]  [phone]

## 2018-05-28 ENCOUNTER — Other Ambulatory Visit (INDEPENDENT_AMBULATORY_CARE_PROVIDER_SITE_OTHER): Payer: Self-pay | Admitting: Family

## 2018-05-28 DIAGNOSIS — G40309 Generalized idiopathic epilepsy and epileptic syndromes, not intractable, without status epilepticus: Secondary | ICD-10-CM

## 2018-06-08 ENCOUNTER — Other Ambulatory Visit (INDEPENDENT_AMBULATORY_CARE_PROVIDER_SITE_OTHER): Payer: Self-pay | Admitting: Family

## 2018-06-08 DIAGNOSIS — G40309 Generalized idiopathic epilepsy and epileptic syndromes, not intractable, without status epilepticus: Secondary | ICD-10-CM

## 2018-08-26 ENCOUNTER — Other Ambulatory Visit (INDEPENDENT_AMBULATORY_CARE_PROVIDER_SITE_OTHER): Payer: Self-pay | Admitting: Family

## 2018-08-26 DIAGNOSIS — G40309 Generalized idiopathic epilepsy and epileptic syndromes, not intractable, without status epilepticus: Secondary | ICD-10-CM

## 2018-09-12 ENCOUNTER — Other Ambulatory Visit (INDEPENDENT_AMBULATORY_CARE_PROVIDER_SITE_OTHER): Payer: Self-pay | Admitting: Family

## 2018-09-12 DIAGNOSIS — R1312 Dysphagia, oropharyngeal phase: Secondary | ICD-10-CM

## 2018-09-12 DIAGNOSIS — G808 Other cerebral palsy: Secondary | ICD-10-CM

## 2018-09-12 DIAGNOSIS — K117 Disturbances of salivary secretion: Secondary | ICD-10-CM

## 2018-09-29 ENCOUNTER — Other Ambulatory Visit: Payer: Self-pay | Admitting: Gastroenterology

## 2018-09-29 MED ORDER — SODIUM CHLORIDE 0.9 % IV SOLN: INTRAVENOUS | Status: DC

## 2018-10-03 ENCOUNTER — Other Ambulatory Visit: Payer: Self-pay | Admitting: Gastroenterology

## 2018-10-03 MED ORDER — SODIUM CHLORIDE 0.9 % IV SOLN: INTRAVENOUS | Status: DC

## 2018-10-04 ENCOUNTER — Ambulatory Visit (HOSPITAL_COMMUNITY)
Admission: RE | Admit: 2018-10-04 | Discharge: 2018-10-04 | Disposition: A | Discharge status: Home / Self Care | Payer: Medicaid Other | Attending: Gastroenterology | Admitting: Gastroenterology

## 2018-10-04 ENCOUNTER — Encounter (HOSPITAL_COMMUNITY)
Admission: RE | Disposition: A | Discharge status: Home / Self Care | Payer: Self-pay | Source: Home / Self Care | Attending: Gastroenterology

## 2018-10-04 DIAGNOSIS — K9423 Gastrostomy malfunction: Secondary | ICD-10-CM | POA: Insufficient documentation

## 2018-10-04 HISTORY — PX: BUTTON CHANGE: SHX5423

## 2018-10-04 SURGERY — REPLACEMENT, BUTTON GASTROSTOMY DEVICE

## 2018-10-04 NOTE — Progress Notes (Signed)
PEG tube exchange was performed at bedside. The cap of previously replaced peg in 9/18 had broken off.  New balloon button PEG, 20 French x4.4 cm was placed after removal of the same sized old PEG.  Kerin Salen, MD

## 2018-10-04 NOTE — H&P (Signed)
28 year old male with malfunctioning of PEG for replacement.

## 2018-10-05 ENCOUNTER — Encounter (HOSPITAL_COMMUNITY): Payer: Self-pay | Admitting: Gastroenterology

## 2018-11-26 ENCOUNTER — Other Ambulatory Visit (INDEPENDENT_AMBULATORY_CARE_PROVIDER_SITE_OTHER): Payer: Self-pay | Admitting: Family

## 2018-11-26 DIAGNOSIS — G40309 Generalized idiopathic epilepsy and epileptic syndromes, not intractable, without status epilepticus: Secondary | ICD-10-CM

## 2018-12-18 ENCOUNTER — Other Ambulatory Visit (INDEPENDENT_AMBULATORY_CARE_PROVIDER_SITE_OTHER): Payer: Self-pay | Admitting: Family

## 2018-12-18 DIAGNOSIS — G40309 Generalized idiopathic epilepsy and epileptic syndromes, not intractable, without status epilepticus: Secondary | ICD-10-CM

## 2018-12-27 ENCOUNTER — Other Ambulatory Visit: Payer: Self-pay

## 2018-12-27 ENCOUNTER — Ambulatory Visit (INDEPENDENT_AMBULATORY_CARE_PROVIDER_SITE_OTHER): Payer: Medicaid Other | Admitting: Family

## 2018-12-27 ENCOUNTER — Encounter (INDEPENDENT_AMBULATORY_CARE_PROVIDER_SITE_OTHER): Payer: Self-pay | Admitting: Family

## 2018-12-27 VITALS — Temp 97.7°F | Wt 120.0 lb

## 2018-12-27 DIAGNOSIS — R1312 Dysphagia, oropharyngeal phase: Secondary | ICD-10-CM | POA: Diagnosis not present

## 2018-12-27 DIAGNOSIS — G253 Myoclonus: Secondary | ICD-10-CM | POA: Diagnosis not present

## 2018-12-27 DIAGNOSIS — G808 Other cerebral palsy: Secondary | ICD-10-CM | POA: Diagnosis not present

## 2018-12-27 DIAGNOSIS — G40309 Generalized idiopathic epilepsy and epileptic syndromes, not intractable, without status epilepticus: Secondary | ICD-10-CM | POA: Diagnosis not present

## 2018-12-27 NOTE — Progress Notes (Signed)
Wayne Meyers   MRN:  [id number]  1991/02/08   This is a Pediatric Specialist E-Visit follow up consult provided via WebEx Wayne Meyers and his father Wayne Meyers consented to an E-Visit consult today.  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 He is taking and tolerating Depakene, Tegretol and Primidone for his seizure disorder and has remained seizure free for years. He has occasional staring spells that have not proven to be seizures.   Today's concerns:  Dad reports today that Wayne Meyers has been doing well since he was last seen. He had replacement of his feeding tube in February and tolerated that procedure well. He has been tolerating feedings and sleeps well. He is sleepy this morning and Dad says that he sometimes is slow to become fully awake and interactive. Wayne Meyers is cared for at home by his father and [employer] workers. Dad says that he has been approved for 56 hours per week and receives additional respite hours.   Wayne Meyers has been otherwise generally healthy and Dad has no other health  concerns for him other than previously mentioned.  Review of systems: Please see HPI for neurologic and other pertinent review of systems. Otherwise all other systems were reviewed and were negative.  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  EEG November 23, 2006 10 - 11 Hz dominant posterior rhythm that was normal with generalized slowing right posterior region greater than left in bursts of independent bilateral electrographic seizure activity for 30 seconds in duration of 2-3 Hz spike and slow-wave discharges equally divided between the left and right hemispheres. There is also a right temporal focus of generalized to both hemispheres. Clinical behaviors were not seen.   Scratched left eye in September of 2015, healed without difficulty.  Fracture of left lower leg in 2016, healed without difficulty.  Surgical history: Past Surgical History:  Procedure Laterality Date   BUTTON CHANGE N/A 04/05/2014   Procedure: BUTTON CHANGE;  Surgeon: Vertell Novak., MD;  Location: [facility];  Service:  Endoscopy;  Laterality: N/A;   BUTTON CHANGE N/A 01/09/2016   Procedure: BUTTON CHANGE;  Surgeon: Dorena Cookey, MD;  Location: [facility];  Service: Endoscopy;  Laterality: N/A;  may need sav dilators   BUTTON  CHANGE N/A 04/14/2017   Procedure: BUTTON CHANGE;  Surgeon: Kerin Salen, MD;  Location: [facility];  Service: Gastroenterology;  Laterality: N/A;   BUTTON CHANGE N/A 10/04/2018   Procedure: BUTTON CHANGE;  Surgeon: Kerin Salen, MD;  Location: [facility];  Service: Gastroenterology;  Laterality: N/A;   CIRCUMCISION  1990   NISSEN FUNDOPLICATION  1986   PEG PLACEMENT  12/07/2011   Procedure: PERCUTANEOUS ENDOSCOPIC GASTROSTOMY (PEG) REPLACEMENT;  Surgeon: Graylin Shiver, MD;  Location: [facility];  Service: Endoscopy;  Laterality: N/A;  20 french 4.4 cent.   PEG PLACEMENT N/A 01/04/2013   Procedure: PERCUTANEOUS ENDOSCOPIC GASTROSTOMY (PEG) REPLACEMENT;  Surgeon: Barrie Folk, MD;  Location: [facility] ENDOSCOPY;  Service: Endoscopy;  Laterality: N/A;   PEG PLACEMENT N/A 03/19/2015   Procedure: PERCUTANEOUS ENDOSCOPIC GASTROSTOMY (PEG) REPLACEMENT;  Surgeon: Charlott Rakes, MD;  Location[facility]COPY;  Service: Endoscopy;  Laterality: N/A;   PEG PLACEMENT N/A 09/21/2016   Procedure: PERCUTANEOUS ENDOSCOPIC GASTROSTOMY (PEG) REPLACEMENT;  Surgeon: Carman Ching, MD;  Location: [facility];  Service: Endoscopy;  Laterality: N/A;     Family history: family history includes Migraines in his mother; Other in his paternal grandfather; Seizures in his brother and mother.   Social history: Social History   Socioeconomic History   Marital status: Single    Spouse name: Not on file   Number of children: Not on file   Years of education: Not on file   Highest education level: Not on file  Occupational History   Not on file  Social Needs   Financial resource strain: Not on file   Food insecurity:    Worry: Not on file    Inability: Not on file   Transportation needs:    Medical: Not on file    Non-medical: Not on file  Tobacco Use   Smoking status: Never Smoker   Smokeless tobacco: Never Used  Substance and Sexual Activity   Alcohol use: No   Drug use: No   Sexual  activity: Never  Lifestyle   Physical activity:    Days per week: Not on file    Minutes per session: Not on file   Stress: Not on file  Relationships   Social connections:    Talks on phone: Not on file    Gets together: Not on file    Attends religious service: Not on file    Active member of club or organization: Not on file    Attends meetings of clubs or organizations: Not on file    Relationship status: Not on file   Intimate partner violence:    Fear of current or ex partner: Not on file    Emotionally abused: Not on file    Physically abused: Not on file    Forced sexual activity: Not on file  Other Topics Concern   Not on file  Social History Narrative   Aul is not currently enrolled in a day program or school. He stays home and has a CNA helping with care. Dad has been having difficulty with continuous care for Jashon since October 2016. Dad reports to no longer being with patient's mother since the beginning of 2015, mother moved to [place]. Abdulla enjoys playing with toys. He lives with his father and brothers.  Allergies: Allergies  Allergen Reactions   Augmentin [Amoxicillin-Pot Clavulanate] Diarrhea    Did it involve swelling of the face/tongue/throat, SOB, or low BP? No Did it involve sudden or severe rash/hives, skin peeling, or any reaction on the inside of your mouth or nose? No Did you need to seek medical attention at a hospital or doctor's office? No When did it last happen?20+ years If all above answers are NO, may proceed with cephalosporin use.     Amoxicillin     Childhood allergy   Powders [Talc] Rash    From powdered gloves    Physical Exam: Temp 97.7 F (36.5 C)    Wt 120 lb (54.4 kg)  (reported by Dad) General: well developed, well nourished young man, lying in bed, in no evident distress; sandy hair, blue eyes, right handed Head: microcephalic and atraumatic. No dysmorphic features. Neck: supple Musculoskeletal: Has  left convex thoracolumbar scoliosis. He has tight shoulders, bilateral elbow contractures left greater than right, contractures of the wrists and knees, with clawhand deformity and tightly fisted left hand. He has subluxation of the left hip with internal/external rotation on the right and equinus deformities of both ankles.  Skin: no rashes or neurocutaneous lesions  Neurologic Exam Mental Status: Drowsy but able to be awakened by his father for me to see him on camera. Has no language. Resistant to invasions into his space. Cranial Nerves: Does not turn to localize faces, objects or sounds in the periphery. Facial movements are asymmetric, has lower facial weakness with drooling. His tongue protrudes and is slightly deviated to the right. Neck flexion and extension abnormal with poor head control.  Motor: Spastic quadriparesis with skeletal deformities as described above. Limited purposeful movements.  Sensory: Withdrawal x 4 Coordination: Unable to adequately assess due to patient's inability to participate in examination.  Gait and Station: Unable to stand and bear weight.   Impression: 1.  Congenital quadriparesis 2.  Generalized convulsive epilepsy 3.  Generalized nonconvulsive epilepsy 4.  Myoclonus 5.  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  Neuromuscular scoliosis 7.  Dysphagia requiring gastrostomy tube  Recommendations for plan of care: The patient's previous [facility] records were reviewed. Trevyn has neither had nor required imaging or lab studies since the last visit, other than what was performed when his feeding tube was changed earlier this year. His is taking and tolerating Depakene, Tegretol and Primidone and has remained seizure free for years. He will continue these medications without change for the foreseeable future. Kosei is doing well at this time and is receiving appropriate support services. I asked Dad to call me if Beverley has any seizures or  if he has any concerns. I will otherwise see him back in follow up in 1 year or sooner if needed.   The medication list was reviewed and reconciled. No changes were made in the prescribed medications today. A complete medication list was provided to the patient.  Allergies as of 12/27/2018      Reactions   Augmentin [amoxicillin-pot Clavulanate] Diarrhea   Did it involve swelling of the face/tongue/throat, SOB, or low BP? No Did it involve sudden or severe rash/hives, skin peeling, or any reaction on the inside of your mouth or nose? No Did you need to seek medical attention at a hospital or doctor's office? No When did it last happen?20+ years If all above answers are NO, may proceed with cephalosporin use.   Amoxicillin    Childhood allergy   Powders [  talc] Rash   From powdered gloves      Medication List       Accurate as of Dec 27, 2018  9:59 AM. If you have any questions, ask your nurse or doctor.        chlorpheniramine 4 MG tablet Commonly known as:  CHLOR-TRIMETON Place 2 mg into feeding tube 3 (three) times daily.   diphenhydrAMINE HCl 50 MG/30ML Liqd Take 33 mg by mouth at bedtime.   feeding supplement (JEVITY 1.5 CAL) Liqd Place 237 mLs into feeding tube 4 (four) times daily.   glycopyrrolate 1 MG tablet Commonly known as:  ROBINUL TAKE 1 TABLET BY MOUTH TWICE A DAY   primidone 250 MG tablet Commonly known as:  MYSOLINE TAKE 1/2 TABLET BY MOUTH 3 TIMES A DAY   TEGretol 100 MG/5ML suspension Generic drug:  carBAMazepine TAKE 15 MLS BY MOUTH 3 TIMES A DAY   valproic acid 250 MG/5ML Soln solution Commonly known as:  DEPAKENE GIVE 15 ML BY G-TUBE 3 TIMES PER DAY       Total time spent on the Webex with the patient was 10 minutes, of which 50% or more was spent in counseling and coordination of care.  Elveria Risingina Cristel Rail NP-C [facility] Ph. [phone] Fax [phone]

## 2018-12-27 NOTE — Patient Instructions (Signed)
Thank you for meeting with me by Webex today.   Instructions for you until your next appointment are as follows: 1. Continue Wayne Meyers's medications as you have been giving them. 2. Let me know if he has any seizures or if you have any concerns.  3. Please sign up for MyChart if you have not done so 4. Please plan to return for follow up in one year or sooner if needed.

## 2019-02-10 ENCOUNTER — Other Ambulatory Visit (INDEPENDENT_AMBULATORY_CARE_PROVIDER_SITE_OTHER): Payer: Self-pay | Admitting: Family

## 2019-02-10 DIAGNOSIS — G40309 Generalized idiopathic epilepsy and epileptic syndromes, not intractable, without status epilepticus: Secondary | ICD-10-CM

## 2019-04-04 ENCOUNTER — Other Ambulatory Visit (INDEPENDENT_AMBULATORY_CARE_PROVIDER_SITE_OTHER): Payer: Self-pay | Admitting: Family

## 2019-04-04 DIAGNOSIS — R1312 Dysphagia, oropharyngeal phase: Secondary | ICD-10-CM

## 2019-04-04 DIAGNOSIS — K117 Disturbances of salivary secretion: Secondary | ICD-10-CM

## 2019-04-04 DIAGNOSIS — G808 Other cerebral palsy: Secondary | ICD-10-CM

## 2019-06-16 ENCOUNTER — Other Ambulatory Visit (INDEPENDENT_AMBULATORY_CARE_PROVIDER_SITE_OTHER): Payer: Self-pay | Admitting: Family

## 2019-06-16 DIAGNOSIS — G40309 Generalized idiopathic epilepsy and epileptic syndromes, not intractable, without status epilepticus: Secondary | ICD-10-CM

## 2019-06-28 ENCOUNTER — Other Ambulatory Visit (INDEPENDENT_AMBULATORY_CARE_PROVIDER_SITE_OTHER): Payer: Self-pay | Admitting: Family

## 2019-06-28 DIAGNOSIS — G40309 Generalized idiopathic epilepsy and epileptic syndromes, not intractable, without status epilepticus: Secondary | ICD-10-CM

## 2019-07-03 ENCOUNTER — Telehealth (INDEPENDENT_AMBULATORY_CARE_PROVIDER_SITE_OTHER): Payer: Self-pay | Admitting: Family

## 2019-07-03 NOTE — Telephone Encounter (Signed)
Spoke with dad to inform him that the refills were sent to his pharmacy yesterday. The pharmacy confirmed that they have the refills.

## 2019-07-03 NOTE — Telephone Encounter (Signed)
°  Who's calling (name and relationship to patient) : Belenda Cruise (Father)  Best contact number: [phone] Provider they see: Otila Kluver Reason for call: Dad requesting refill on pt's Primidone.      PRESCRIPTION REFILL ONLY  Name of prescription: Primidone  Ph[facility]: [facility] [place], [place]

## 2019-07-29 ENCOUNTER — Other Ambulatory Visit (INDEPENDENT_AMBULATORY_CARE_PROVIDER_SITE_OTHER): Payer: Self-pay | Admitting: Family

## 2019-07-29 DIAGNOSIS — G40309 Generalized idiopathic epilepsy and epileptic syndromes, not intractable, without status epilepticus: Secondary | ICD-10-CM

## 2019-07-30 ENCOUNTER — Telehealth (INDEPENDENT_AMBULATORY_CARE_PROVIDER_SITE_OTHER): Payer: Self-pay | Admitting: Family

## 2019-07-30 DIAGNOSIS — G40309 Generalized idiopathic epilepsy and epileptic syndromes, not intractable, without status epilepticus: Secondary | ICD-10-CM

## 2019-07-30 MED ORDER — TEGRETOL 100 MG/5ML PO SUSP: ORAL | 5 refills | Status: DC

## 2019-07-30 NOTE — Telephone Encounter (Signed)
Who's calling (name and relationship to patient) : [facility] [place]  Best contact number: [phone]  Provider they see: Rockwell Germany  Reason for call:  Needs to be hand written on rx "brand name medically necessary" and scanned back over   Call ID:      PRESCRIPTION REFILL ONLY  Name of prescription: Tegretol   Pharmacy: [facility] [place], [place]

## 2019-07-30 NOTE — Telephone Encounter (Signed)
Rx printed. Will fax to the pharmacy. TG

## 2019-11-29 ENCOUNTER — Other Ambulatory Visit (INDEPENDENT_AMBULATORY_CARE_PROVIDER_SITE_OTHER): Payer: Self-pay | Admitting: Family

## 2019-11-29 DIAGNOSIS — K117 Disturbances of salivary secretion: Secondary | ICD-10-CM

## 2019-11-29 DIAGNOSIS — G808 Other cerebral palsy: Secondary | ICD-10-CM

## 2019-11-29 DIAGNOSIS — R1312 Dysphagia, oropharyngeal phase: Secondary | ICD-10-CM

## 2019-11-29 MED ORDER — GLYCOPYRROLATE 1 MG PO TABS: 1.0000 mg | ORAL_TABLET | Freq: Two times a day (BID) | ORAL | 5 refills | Status: DC

## 2019-12-24 ENCOUNTER — Telehealth (INDEPENDENT_AMBULATORY_CARE_PROVIDER_SITE_OTHER): Payer: Medicaid Other | Admitting: Family

## 2019-12-24 ENCOUNTER — Encounter (INDEPENDENT_AMBULATORY_CARE_PROVIDER_SITE_OTHER): Payer: Self-pay | Admitting: Family

## 2019-12-24 DIAGNOSIS — G808 Other cerebral palsy: Secondary | ICD-10-CM

## 2019-12-24 DIAGNOSIS — G40309 Generalized idiopathic epilepsy and epileptic syndromes, not intractable, without status epilepticus: Secondary | ICD-10-CM

## 2019-12-24 DIAGNOSIS — R1312 Dysphagia, oropharyngeal phase: Secondary | ICD-10-CM

## 2019-12-24 DIAGNOSIS — K117 Disturbances of salivary secretion: Secondary | ICD-10-CM

## 2019-12-24 MED ORDER — TEGRETOL 100 MG/5ML PO SUSP: ORAL | 5 refills | Status: DC

## 2019-12-24 MED ORDER — PRIMIDONE 250 MG PO TABS: ORAL_TABLET | ORAL | 5 refills | Status: DC

## 2019-12-24 MED ORDER — VALPROIC ACID 250 MG/5ML PO SOLN: ORAL | 5 refills | Status: DC

## 2019-12-24 MED ORDER — GLYCOPYRROLATE 1 MG PO TABS: 1.0000 mg | ORAL_TABLET | Freq: Two times a day (BID) | ORAL | 5 refills | Status: DC

## 2019-12-24 NOTE — Progress Notes (Signed)
This is a Pediatric Specialist E-Visit follow up consult provided via MyChart video Wayne Meyers and his father Bartlett Enke consented to an E-Visit consult today.  Location of patient: Wayne Meyers is at home Location of provider: Damita Dunnings is at office Patient was referred by Irena Reichmann, DO   The following participants were involved in this E-Visit: CMA, NP and patient's father  Chief Complain/ Reason for E-Visit today: seizure follow up Total time on call: 20 min Follow up: 1 year   DASHEL GOINES   MRN:  [id number]  Jun 03, 1991   Provider: Elveria Rising NP-C Location of Care: [facility]  Visit type: Telehealth visit  Last visit: 12/27/2018  Referral source: Irena Reichmann, MD History from: father, patient, and chcn chart  Brief history:  Copied from previous record: 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 He is taking and tolerating Depakene, Tegretol and Primidone for his seizure disorder and has remained seizure free for years. He has occasional staring spells that have not proven to be seizures.   Today's concerns: Dad reports today that Wayne Meyers has remained seizure free since his last visit. He had Covid infection in February and had a fever of 102 for 4 days, but fortunately weathered that well. His father and brother also had Covid infection at the same time.   Wayne Meyers has some trouble going to sleep at night but Dad reports that Melatonin helps with that. He says that Wayne Meyers has been tolerating his feedings and otherwise doing well. Dad has no other health concerns for Wayne Meyers today other than previously  mentioned.   Review of systems: Please see HPI for neurologic and other pertinent review of systems. Otherwise all other systems were reviewed and were negative.  Problem List: Patient Active Problem List   Diagnosis Date Noted  . 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 Myoclonus 04/11/2013  . Congenital quadriplegia (HCC) 04/11/2013  . Dysphagia, oropharyngeal phase 04/11/2013  . Neuromuscular scoliosis of lumbosacral region 04/11/2013  . 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 Cerebral palsy (HCC)   . Esophageal reflux   . Mental retardation   . Seizure (HCC)   . Vision abnormalities     Past medical history comments: See HPI Copied from previous record: Cardiac arrest in the nursery following surgical repair of gastroschisis.  EEG November 23, 2006 10 - 11 Hz dominant posterior rhythm that was normal with generalized slowing right posterior region greater than left in bursts of independent bilateral electrographic seizure activity for 30 seconds in duration of 2-3 Hz spike and slow-wave discharges equally divided between the left and right hemispheres. There is also a right temporal focus of generalized to both hemispheres. Clinical behaviors were not seen.   Scratched left eye in September of 2015, healed without difficulty.  Fracture of left lower leg in 2016, healed without difficulty.  Surgical history: Past Surgical History:  Procedure Laterality Date  . BUTTON CHANGE N/A 04/05/2014   Procedure: BUTTON CHANGE;  Surgeon: Vertell Novak., MD;  Location: [facility];  Service: Endoscopy;  Laterality: N/A;  . BUTTON CHANGE N/A  01/09/2016   Procedure: BUTTON CHANGE;  Surgeon: Dorena Cookey, MD;  Location: [facility];  Service: Endoscopy;  Laterality: N/A;  may need sav dilators  . BUTTON CHANGE N/A 04/14/2017   Procedure:  BUTTON CHANGE;  Surgeon: Kerin Salen, MD;  Location: [facility];  Service: Gastroenterology;  Laterality: N/A;  . BUTTON CHANGE N/A 10/04/2018   Procedure: BUTTON CHANGE;  Surgeon: Kerin Salen, MD;  Location: [facility];  Service: Gastroenterology;  Laterality: N/A;  . CIRCUMCISION  1990  . NISSEN FUNDOPLICATION  1986  . PEG PLACEMENT  12/07/2011   Procedure: PERCUTANEOUS ENDOSCOPIC GASTROSTOMY (PEG) REPLACEMENT;  Surgeon: Graylin Shiver, MD;  Location: [facility];  Service: Endoscopy;  Laterality: N/A;  20 french 4.4 cent.  Marland Kitchen PEG PLACEMENT N/A 01/04/2013   Procedure: PERCUTANEOUS ENDOSCOPIC GASTROSTOMY (PEG) REPLACEMENT;  Surgeon: Barrie Folk, MD;  Location: [facility]COPY;  Service: Endoscopy;  Laterality: N/A;  . PEG PLACEMENT N/A 03/19/2015   Procedure: PERCUTANEOUS ENDOSCOPIC GASTROSTOMY (PEG) REPLACEMENT;  Surgeon: Charlott Rakes, MD;  Location: [facility];  Service: Endoscopy;  Laterality: N/A;  . PEG PLACEMENT N/A 09/21/2016   Procedure: PERCUTANEOUS ENDOSCOPIC GASTROSTOMY (PEG) REPLACEMENT;  Surgeon: Carman Ching, MD;  Location: [facility];  Service: Endoscopy;  Laterality: N/A;     Family history: family history includes Migraines in his mother; Other in his paternal grandfather; Seizures in his brother and mother.   Social history: Social History   Socioeconomic History  . Marital status: Single    Spouse name: Not on file  . Number of children: Not on file  . Years of education: Not on file  . Highest education level: Not on file  Occupational History  . Not on file  Tobacco Use  . Smoking status: Never Smoker  . Smokeless tobacco: Never Used  Substance and Sexual Activity  . Alcohol use: No  . Drug use: No  . Sexual activity: Never  Other Topics Concern  . Not on file  Social History Narrative   Wayne Meyers is not currently enrolled in a day program or school. He stays home and has a CNA helping with care. Dad has been having difficulty with continuous care for  Eann since October 2016. Dad reports to no longer being with patient's mother since the beginning of 2015, mother moved to [place]. Said enjoys playing with toys. He lives with his father and brothers.     Social Determinants of Health   Financial Resource Strain:   . Difficulty of Paying Living Expenses:   Food Insecurity:   . Worried About Programme researcher, broadcasting/film/video in the Last Year:   . Barista in the Last Year:   Transportation Needs:   . Freight forwarder (Medical):   Marland Kitchen Lack of Transportation (Non-Medical):   Physical Activity:   . Days of Exercise per Week:   . Minutes of Exercise per Session:   Stress:   . Feeling of Stress :   Social Connections:   . Frequency of Communication with Friends and Family:   . Frequency of Social Gatherings with Friends and Family:   . Attends Religious Services:   . Active Member of Clubs or Organizations:   . Attends Banker Meetings:   Marland Kitchen Marital Status:   Intimate Partner Violence:   . Fear of Current or Ex-Partner:   . Emotionally Abused:   Marland Kitchen Physically Abused:   . Sexually Abused:     Past/failed meds:   Allergies: Allergies  Allergen Reactions  .  Augmentin [Amoxicillin-Pot Clavulanate] Diarrhea    Did it involve swelling of the face/tongue/throat, SOB, or low BP? No Did it involve sudden or severe rash/hives, skin peeling, or any reaction on the inside of your mouth or nose? No Did you need to seek medical attention at a hospital or doctor's office? No When did it last happen?20+ years If all above answers are "NO", may proceed with cephalosporin use.    Marland Kitchen Amoxicillin     Childhood allergy  . Powders [Talc] Rash    From powdered gloves     Immunizations:  There is no immunization history on file for this patient.    Diagnostics/Screenings:   Physical Exam: There were no vitals taken for this visit.  General: well developed, well nourished young man, lying in bed, in no evident  distress; sandy hair, blue eyes, right handed Head: microcephalic and atraumatic. No dysmorphic features. Neck: supple Musculoskeletal: Has left convex thoracolumbar scoliosis. He has tight shoulders, bilateral elbow contractures left greater than right, contractures of the wrists and knees, clawhand deformity and tightly fisted left hand. He has subluxation of the left hip with internal/external rotation on the right and equinus deformities of both ankles Skin: no rashes or neurocutaneous lesions  Neurologic Exam Mental Status: Awake and fully alert. Has no language.  Smiles responsively. Resistant to invasions in to his space Cranial Nerves: Does not turn to localize faces, objects or sounds in the periphery. Facial movements are asymmetric, has lower facial weakness with drooling.  His tongue protrudes and slightly deviates to the right. Neck flexion and extension abnormal with poor head control.  Motor: Spastic quadriparesis with skeletal deformities as described. Very limited purposeful movements.  Sensory: Withdrawal x 4 Coordination: Unable to adequately assess due to patient's inability to participate in examination. Does not reach for objects. Gait and Station: Unable to stand and bear weight.  Impression: 1. Congenital quadriparesis 2. Generalized convulsive epilepsy 3. Generalized nonconvulsive epilepsy 4. Myoclonus 5. 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 Neuromuscular scoliosis 7. Dysphagia requiring gastrostomy tube   Recommendations for plan of care: The patient's previous [facility] records were reviewed. Seaborn has neither had nor required imaging or lab studies since the last visit. He is a 29 year old man with history of gastroschisis with complication of cardiopulmonary arrest as an infant resulting in congenital quadriparesis, generalized convulsive and nonconvulsive epilepsy, myoclonus, disorder of the visual cortex associated with cortical  blindness, neuromuscular scoliosis and dysphagia requiring gastrostomy tube. He is taking and tolerating Primidone, Tegretol and Valproic acid for his seizure disorder. He has remained seizure free for years on this regimen. I asked Dad to call me if Shant has any seizures. He is otherwise doing well and receiving appropriate support services. I will see him back in follow up in 1 year or sooner if needed. Dad agreed with the plans made today.   The medication list was reviewed and reconciled. No changes were made in the prescribed medications today. A complete medication list was provided to the patient.  Allergies as of 12/24/2019      Reactions   Augmentin [amoxicillin-pot Clavulanate] Diarrhea   Did it involve swelling of the face/tongue/throat, SOB, or low BP? No Did it involve sudden or severe rash/hives, skin peeling, or any reaction on the inside of your mouth or nose? No Did you need to seek medical attention at a hospital or doctor's office? No When did it last happen?20+ years If all above answers are "NO",  may proceed with cephalosporin use.   Amoxicillin    Childhood allergy   Powders [talc] Rash   From powdered gloves      Medication List       Accurate as of Dec 24, 2019  9:34 AM. If you have any questions, ask your nurse or doctor.        chlorpheniramine 4 MG tablet Commonly known as: CHLOR-TRIMETON Place 2 mg into feeding tube 3 (three) times daily.   diphenhydrAMINE HCl 50 MG/30ML Liqd Take 33 mg by mouth at bedtime.   feeding supplement (JEVITY 1.5 CAL) Liqd Place 237 mLs into feeding tube 4 (four) times daily.   glycopyrrolate 1 MG tablet Commonly known as: ROBINUL Take 1 tablet (1 mg total) by mouth 2 (two) times daily.   primidone 250 MG tablet Commonly known as: MYSOLINE TAKE 1/2 TABLET BY MOUTH 3 TIMES A DAY   TEGretol 100 MG/5ML suspension Generic drug: carBAMazepine TAKE 15 MLS BY MOUTH 3 TIMES A DAY   valproic acid 250 MG/5ML  solution Commonly known as: DEPAKENE GIVE 15 ML BY G-TUBE 3 TIMES PER DAY      Total time spent with the patient was 20 minutes, of which 50% or more was spent in counseling and coordination of care.  Rockwell Germany NP-C [facility] Ph. [phone] Fax [phone]

## 2019-12-25 ENCOUNTER — Encounter (INDEPENDENT_AMBULATORY_CARE_PROVIDER_SITE_OTHER): Payer: Self-pay | Admitting: Family

## 2019-12-25 NOTE — Patient Instructions (Signed)
Thank you for meeting with me by video today.   Instructions for you until your next appointment are as follows: 1. Continue giving the seizure medicines as you have been doing 2. Let me know if Silvester has any seizures or if you have any other concerns 3. Please sign up for MyChart if you have not done so 4. Please plan to return for follow up in one year or sooner if needed.

## 2020-01-17 ENCOUNTER — Other Ambulatory Visit (INDEPENDENT_AMBULATORY_CARE_PROVIDER_SITE_OTHER): Payer: Self-pay | Admitting: Family

## 2020-01-17 DIAGNOSIS — G40309 Generalized idiopathic epilepsy and epileptic syndromes, not intractable, without status epilepticus: Secondary | ICD-10-CM

## 2020-01-17 MED ORDER — TEGRETOL 100 MG/5ML PO SUSP: ORAL | 5 refills | Status: DC

## 2020-01-17 MED ORDER — VALPROIC ACID 250 MG/5ML PO SOLN: ORAL | 5 refills | Status: DC

## 2020-02-13 ENCOUNTER — Ambulatory Visit (HOSPITAL_COMMUNITY)
Admission: RE | Admit: 2020-02-13 | Discharge: 2020-02-13 | Disposition: A | Discharge status: Home / Self Care | Payer: Medicaid Other | Attending: Gastroenterology | Admitting: Gastroenterology

## 2020-02-13 ENCOUNTER — Encounter (HOSPITAL_COMMUNITY)
Admission: RE | Disposition: A | Discharge status: Home / Self Care | Payer: Self-pay | Source: Home / Self Care | Attending: Gastroenterology

## 2020-02-13 DIAGNOSIS — K9423 Gastrostomy malfunction: Secondary | ICD-10-CM | POA: Diagnosis present

## 2020-02-13 HISTORY — PX: PEG PLACEMENT: SHX5437

## 2020-02-13 SURGERY — REPLACEMENT, PEG TUBE, WITHOUT ENDOSCOPY

## 2020-02-13 MED ORDER — SODIUM CHLORIDE 0.9 % IV SOLN: INTRAVENOUS | Status: DC

## 2020-02-13 MED ORDER — SODIUM CHLORIDE (PF) 0.9 % IJ SOLN
INTRAMUSCULAR | Status: AC
  Filled 2020-02-13: qty 10

## 2020-02-13 NOTE — Progress Notes (Signed)
Patient ID: Wayne Meyers, male   DOB: [birth year], 29 y.o.   MRN: [id number]  PEG button change was performed a bedside in [facility] end[facility]. The current PEG button was malfunctioning and needed to be replaced.  The current 20 Fr button PEG was removed and a new 20 Fr X 4.4 cm button PEG was placed into the percutaneous abdominal opening after testing the 10 cc balloon to verify it was working properly. The new button PEG was dressed with gauze.

## 2020-02-13 NOTE — Progress Notes (Addendum)
PEG button removed by MD. New PEG button placed. Patient tolerated well. Split dressing placed over PEG button. Patient discharged to home.

## 2020-02-13 NOTE — Progress Notes (Signed)
Patient ID: Wayne Meyers, male   DOB: Jun 26, 1991, 29 y.o.   MRN: [id number]  29 yo with malfunctioning PEG button in need of it replaced with a new 20 French PEG button.

## 2020-02-15 ENCOUNTER — Encounter (HOSPITAL_COMMUNITY): Payer: Self-pay | Admitting: Gastroenterology

## 2020-03-17 ENCOUNTER — Telehealth (INDEPENDENT_AMBULATORY_CARE_PROVIDER_SITE_OTHER): Payer: Self-pay | Admitting: Family

## 2020-03-17 NOTE — Telephone Encounter (Signed)
°  Who's calling (name and relationship to patient) : Earl Lites (dad)  Best contact number: [phone]  Provider they see: Elveria Rising  Reason for call: Dad states that he emailed Inetta Fermo last week about getting patient a new hospital bed. He requests call back with status of that request.    PRESCRIPTION REFILL ONLY  Name of prescription:  Pharmacy:

## 2020-03-17 NOTE — Telephone Encounter (Signed)
I emailed Dad and told him that Wayne Meyers needs a face to face visit for a new bed. I will follow up with him to schedule that visit. TG

## 2020-03-18 ENCOUNTER — Encounter (INDEPENDENT_AMBULATORY_CARE_PROVIDER_SITE_OTHER): Payer: Self-pay | Admitting: Family

## 2020-03-18 ENCOUNTER — Telehealth (INDEPENDENT_AMBULATORY_CARE_PROVIDER_SITE_OTHER): Payer: Medicaid Other | Admitting: Family

## 2020-03-18 DIAGNOSIS — G808 Other cerebral palsy: Secondary | ICD-10-CM | POA: Diagnosis not present

## 2020-03-18 DIAGNOSIS — G40309 Generalized idiopathic epilepsy and epileptic syndromes, not intractable, without status epilepticus: Secondary | ICD-10-CM | POA: Diagnosis not present

## 2020-03-18 DIAGNOSIS — M4147 Neuromuscular scoliosis, lumbosacral region: Secondary | ICD-10-CM | POA: Diagnosis not present

## 2020-03-18 DIAGNOSIS — H47619 Cortical blindness, unspecified side of brain: Secondary | ICD-10-CM | POA: Diagnosis not present

## 2020-03-18 NOTE — Progress Notes (Signed)
This is a Pediatric Specialist E-Visit follow up consult provided via Caregility Wayne Meyers and his father Wayne Meyers consented to an E-Visit consult today.  Location of patient: [place]ond[place] is at home Location of provider: Damita Dunnings is at office Patient was referred by Irena Reichmann, DO   The following participants were involved in this E-Visit: NP, patient and his father  Chief Complain/ Reason for E-Visit today: Needs new bed Total time on call: 15 min Follow up: May 2022   Wayne Meyers   MRN:  [id number]  12-22-1990   Provider: Elveria Rising NP-C Location of Care: [facility]  Visit type: Face to face visit for equipment  Last visit: 12/24/2019  Referral source: Irena Reichmann, MD History from: Epic chart and patient's father  Brief history:  Copied from previous record: 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 He is taking and tolerating Depakene, Tegretol and Primidone for his seizure disorder and has remained seizure free for years. He has occasional staring spells that have not proven to be seizures.  Today's concerns: Wayne Meyers is seen today because of need for new equipment. He has a semi-electric hospital bed in which the controls are broken and the bed will no longer raise or lower. This creates problems for Wayne Meyers as he is unable to lie flat in bed as well as risk of injury for caregivers as he is difficult to lift with the bed in the low position. Wayne Meyers is unable to perform any activities of daily living independently. He is unable to reposition himself in bed, or transfer from bed to chair. Caregivers  must lift him for these maneuvers. It is necessary for Wayne Meyers to receive a semi-electric hospital bed because of his medical condition.   Wayne Meyers has remained seizure free and has been otherwise generally healthy since he was last seen. His father has no other health concerns for him today other than previously mentioned.  Review of systems: Please see HPI for neurologic and other pertinent review of systems. Otherwise all other systems were reviewed and were negative.  Problem List: Patient Active Problem List   Diagnosis Date Noted  . 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 Myoclonus 04/11/2013  . Congenital quadriplegia (HCC) 04/11/2013  . Dysphagia, oropharyngeal phase 04/11/2013  . Neuromuscular scoliosis of lumbosacral region 04/11/2013  . 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 Cerebral palsy (HCC)   . Esophageal reflux   . Mental retardation   . Seizure (HCC)   . Vision abnormalities     Past medical history comments: See HPI Copied from previous record: Cardiac arrest in the nursery following surgical repair of gastroschisis.  EEG November 23, 2006 10 - 11 Hz dominant posterior rhythm that was normal with generalized slowing right posterior region greater than left in bursts of independent bilateral electrographic seizure activity for 30 seconds in duration of 2-3 Hz spike and slow-wave discharges equally divided between the left and right hemispheres. There is also a right temporal focus of generalized to both hemispheres. Clinical behaviors were not seen.   Scratched left eye in September of  2015, healed without difficulty.  Fracture of left lower leg in 2016, healed without difficulty.  Surgical history: Past Surgical History:  Procedure Laterality Date  . BUTTON CHANGE N/A 04/05/2014   Procedure: BUTTON CHANGE;  Surgeon:  Vertell Novak., MD;  Location: [facility];  Service: Endoscopy;  Laterality: N/A;  . BUTTON CHANGE N/A 01/09/2016   Procedure: BUTTON CHANGE;  Surgeon: Dorena Cookey, MD;  Location: [facility];  Service: Endoscopy;  Laterality: N/A;  may need sav dilators  . BUTTON CHANGE N/A 04/14/2017   Procedure: BUTTON CHANGE;  Surgeon: Kerin Salen, MD;  Location: [facility];  Service: Gastroenterology;  Laterality: N/A;  . BUTTON CHANGE N/A 10/04/2018   Procedure: BUTTON CHANGE;  Surgeon: Kerin Salen, MD;  Location: [facility];  Service: Gastroenterology;  Laterality: N/A;  . CIRCUMCISION  1990  . NISSEN FUNDOPLICATION  1986  . PEG PLACEMENT  12/07/2011   Procedure: PERCUTANEOUS ENDOSCOPIC GASTROSTOMY (PEG) REPLACEMENT;  Surgeon: Graylin Shiver, MD;  Location: [facility];  Service: Endoscopy;  Laterality: N/A;  20 french 4.4 cent.  Marland Kitchen PEG PLACEMENT N/A 01/04/2013   Procedure: PERCUTANEOUS ENDOSCOPIC GASTROSTOMY (PEG) REPLACEMENT;  Surgeon: Barrie Folk, MD;  Location: [facility];  Service: Endoscopy;  Laterality: N/A;  . PEG PLACEMENT N/A 03/19/2015   Procedure: PERCUTANEOUS ENDOSCOPIC GASTROSTOMY (PEG) REPLACEMENT;  Surgeon: Charlott Rakes, MD;  Location[facility];  Service: Endoscopy;  Laterality: N/A;  . PEG PLACEMENT N/A 09/21/2016   Procedure: PERCUTANEOUS ENDOSCOPIC GASTROSTOMY (PEG) REPLACEMENT;  Surgeon: Carman Ching, MD;  Location: [facility];  Service: Endoscopy;  Laterality: N/A;  . PEG PLACEMENT N/A 02/13/2020   Procedure: PERCUTANEOUS ENDOSCOPIC GASTROSTOMY (PEG) REPLACEMENT;  Surgeon: Charlott Rakes, MD;  Location: [facility];  Service: Endoscopy;  Laterality: N/A;     Family history: family history includes Migraines in his mother; Other in his paternal grandfather; Seizures in his brother and mother.   Social history: Social History   Socioeconomic History  . Marital status: Single    Spouse name: Not on file  . Number of children: Not on file  . Years of education:  Not on file  . Highest education level: Not on file  Occupational History  . Not on file  Tobacco Use  . Smoking status: Never Smoker  . Smokeless tobacco: Never Used  Substance and Sexual Activity  . Alcohol use: No  . Drug use: No  . Sexual activity: Never  Other Topics Concern  . Not on file  Social History Narrative   B[place]ston is not currently enrolled in a day program or school. He stays home and has a CNA helping with care. Dad has been having difficulty with continuous care for Edoardo since October 2016. Dad reports to no longer being with patient's mother since the beginning of 2015, mother moved to [place]. Adolfo enjoys playing with toys. He lives with his father and brothers.     Social Determinants of Health   Financial Resource Strain:   . Difficulty of Paying Living Expenses:   Food Insecurity:   . Worried About Programme researcher, broadcasting/film/video in the Last Year:   . Barista in the Last Year:   Transportation Needs:   . Freight forwarder (Medical):   Marland Kitchen Lack of Transportation (Non-Medical):   Physical Activity:   . Days of Exercise per Week:   . Minutes of Exercise per Session:   Stress:   . Feeling of Stress :   Social Connections:   . Frequency of  Communication with Friends and Family:   . Frequency of Social Gatherings with Friends and Family:   . Attends Religious Services:   . Active Member of Clubs or Organizations:   . Attends Banker Meetings:   Marland Kitchen Marital Status:   Intimate Partner Violence:   . Fear of Current or Ex-Partner:   . Emotionally Abused:   Marland Kitchen Physically Abused:   . Sexually Abused:     Past/failed meds:   Allergies: Allergies  Allergen Reactions  . Augmentin [Amoxicillin-Pot Clavulanate] Diarrhea    Did it involve swelling of the face/tongue/throat, SOB, or low BP? No Did it involve sudden or severe rash/hives, skin peeling, or any reaction on the inside of your mouth or nose? No Did you need to seek medical attention at  a hospital or doctor's office? No When did it last happen?20+ years If all above answers are "NO", may proceed with cephalosporin use.    Marland Kitchen Amoxicillin     Childhood allergy  . Powders [Talc] Rash    From powdered gloves    Immunizations:  There is no immunization history on file for this patient.    Diagnostics/Screenings:  Physical Exam: There were no vitals taken for this visit.  General: well developed, well nourished young man, seated in wheelchair, in no evident distress; sandy hair, blue eyes, right handed Head: microcephalic and atraumatic. No dysmorphic features. Neck: supple and has some trouble maintaining his head midline Musculoskeletal: has left convex thoracolumbar scoliosis. He has tight shoulders, bilateral elbow contractures, left greater than right, contractures of the wrists and knees, clawhand deformity, and tightly fisted left hand. He has subluxation of the left hip with internal/external rotation on the right and equinus deformities of both feet Skin: no rashes or neurocutaneous lesions  Neurologic Exam Mental Status: awake and fully alert. Has no language.  Smiles responsively. Resistant to invasions into his space Cranial Nerves: does not consistently turn to localize faces, objects or sounds in the periphery. Facial movements are asymmetric, has lower facial weakness with drooling. His tongue protrudes and slightly deviates to the right. Neck flexion and extension abnormal with poor head control.  Motor: spastic quadriparesis with skeletal deformities as described. Very limited purposeful movements Sensory: withdrawal x 4 Coordination: unable to adequately assess due to patient's inability to participate in examination. Does not reach for objects. Gait and Station: unable to stand and bear weight.  Impression: 1. Congenital quadriparesis 2. Generalized convulsive epilepsy 3. Generalized non-convulsive epilepsy 4. Myoclonus 5. 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 Neuromuscular scoliosis 7. Dysphagia requiring gastrostomy tube  Recommendations for plan of care: The patient's previous [facility] records were reviewed. Wayne Meyers has neither had nor required imaging or lab studies since the last visit. 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 He is unable to perform any activities of daily living and is dependent upon a caregiver. Wayne Meyers has a hospital bed but the controls are broken and it will no longer raise or lower. This creates a problem for positioning Wayne Meyers as he is unable to tolerate lying flat and his caregivers are unable to lift and reposition him with the bed in the low position. He needs a new semi-electric hospital bed due to his medical condition and to  reduce risk of injury of caregivers lifting Wayne Mattendy to reposition or transfer him to the wheelchair. I will request a new bed from his DME provider.   Wayne Mattendy will continue his medications without change for now. I asked Dad to let me know if Wayne Mattendy has breakthrough seizures or if he has any other concerns. Dad agreed with the plans made today.   The medication list was reviewed and reconciled. No changes were made in the prescribed medications today. A complete medication list was provided to the patient.  Allergies as of 03/18/2020      Reactions   Augmentin [amoxicillin-pot Clavulanate] Diarrhea   Did it involve swelling of the face/tongue/throat, SOB, or low BP? No Did it involve sudden or severe rash/hives, skin peeling, or any reaction on the inside of your mouth or nose? No Did you need to seek medical attention at a hospital or doctor's office? No When did  it last happen?20+ years If all above answers are "NO", may proceed with cephalosporin use.   Amoxicillin    Childhood allergy   Powders [talc] Rash   From powdered gloves      Medication List       Accurate as of March 18, 2020  8:57 AM. If you have any questions, ask your nurse or doctor.        chlorpheniramine 4 MG tablet Commonly known as: CHLOR-TRIMETON Place 2 mg into feeding tube 3 (three) times daily.   diphenhydrAMINE HCl 50 MG/30ML Liqd Take 33 mg by mouth at bedtime.   feeding supplement (JEVITY 1.5 CAL) Liqd Place 237 mLs into feeding tube 4 (four) times daily.   glycopyrrolate 1 MG tablet Commonly known as: ROBINUL Take 1 tablet (1 mg total) by mouth 2 (two) times daily.   primidone 250 MG tablet Commonly known as: MYSOLINE TAKE 1/2 TABLET BY MOUTH 3 TIMES A DAY   TEGretol 100 MG/5ML suspension Generic drug: carBAMazepine TAKE 15 MLS BY MOUTH 3 TIMES A DAY   valproic acid 250 MG/5ML solution Commonly known as: DEPAKENE GIVE 15 ML BY G-TUBE 3 TIMES PER DAY      Total time spent with the patient and his father was 15 minutes, of which 50% or more was spent in counseling and coordination of care.  Elveria Risingina Jonah Nestle NP-C [facility] Ph. [phone] Fax [phone]

## 2020-03-18 NOTE — Patient Instructions (Signed)
Thank you for meeting with me by video visit today.   I will order a new bed for [facility]. Please follow up with [facility] about this and let me know if you need anything else.   Please sign up for MyChart if you have not done so  Please plan to return for follow up in May 2022 or sooner if needed.

## 2020-05-18 DIAGNOSIS — Z931 Gastrostomy status: Secondary | ICD-10-CM | POA: Insufficient documentation

## 2020-06-26 ENCOUNTER — Other Ambulatory Visit (INDEPENDENT_AMBULATORY_CARE_PROVIDER_SITE_OTHER): Payer: Self-pay | Admitting: Family

## 2020-06-26 DIAGNOSIS — K117 Disturbances of salivary secretion: Secondary | ICD-10-CM

## 2020-06-26 DIAGNOSIS — R1312 Dysphagia, oropharyngeal phase: Secondary | ICD-10-CM

## 2020-06-26 DIAGNOSIS — G808 Other cerebral palsy: Secondary | ICD-10-CM

## 2020-06-26 MED ORDER — GLYCOPYRROLATE 1 MG PO TABS: 1.0000 mg | ORAL_TABLET | Freq: Three times a day (TID) | ORAL | 5 refills | Status: DC

## 2020-07-18 ENCOUNTER — Other Ambulatory Visit (INDEPENDENT_AMBULATORY_CARE_PROVIDER_SITE_OTHER): Payer: Self-pay | Admitting: Family

## 2020-07-18 DIAGNOSIS — G40309 Generalized idiopathic epilepsy and epileptic syndromes, not intractable, without status epilepticus: Secondary | ICD-10-CM

## 2020-07-18 MED ORDER — TEGRETOL 100 MG/5ML PO SUSP: ORAL | 5 refills | Status: DC

## 2020-07-18 NOTE — Telephone Encounter (Signed)
  Who's calling (name and relationship to patient) : Earl Lites ( dad)  Best contact number: [phone]  Provider they see: Elveria Rising  Reason for call: Medication refill patient is completely out of this medication. Dad said pharmacy told him they having been reaching out for several days     PRESCRIPTION REFILL ONLY  Name of prescription:Tegretol   Pharmacy: [facility]  [address] [place]

## 2020-08-10 ENCOUNTER — Other Ambulatory Visit (INDEPENDENT_AMBULATORY_CARE_PROVIDER_SITE_OTHER): Payer: Self-pay | Admitting: Family

## 2020-08-10 DIAGNOSIS — G40309 Generalized idiopathic epilepsy and epileptic syndromes, not intractable, without status epilepticus: Secondary | ICD-10-CM

## 2020-09-17 ENCOUNTER — Other Ambulatory Visit (INDEPENDENT_AMBULATORY_CARE_PROVIDER_SITE_OTHER): Payer: Self-pay | Admitting: Family

## 2020-09-17 DIAGNOSIS — G40309 Generalized idiopathic epilepsy and epileptic syndromes, not intractable, without status epilepticus: Secondary | ICD-10-CM

## 2020-09-17 MED ORDER — VALPROIC ACID 250 MG/5ML PO SOLN: ORAL | 5 refills | Status: DC

## 2020-11-26 ENCOUNTER — Telehealth (INDEPENDENT_AMBULATORY_CARE_PROVIDER_SITE_OTHER): Payer: Medicaid Other | Admitting: Family

## 2020-11-26 DIAGNOSIS — K117 Disturbances of salivary secretion: Secondary | ICD-10-CM

## 2020-11-26 DIAGNOSIS — K5901 Slow transit constipation: Secondary | ICD-10-CM

## 2020-11-26 DIAGNOSIS — G40309 Generalized idiopathic epilepsy and epileptic syndromes, not intractable, without status epilepticus: Secondary | ICD-10-CM

## 2020-11-26 DIAGNOSIS — R1312 Dysphagia, oropharyngeal phase: Secondary | ICD-10-CM

## 2020-11-26 DIAGNOSIS — G808 Other cerebral palsy: Secondary | ICD-10-CM

## 2020-11-26 DIAGNOSIS — G4700 Insomnia, unspecified: Secondary | ICD-10-CM

## 2020-11-26 MED ORDER — GLYCOPYRROLATE 1 MG PO TABS: 1.0000 mg | ORAL_TABLET | Freq: Three times a day (TID) | ORAL | 5 refills | Status: DC

## 2020-11-26 MED ORDER — PRIMIDONE 250 MG PO TABS: ORAL_TABLET | ORAL | 5 refills | Status: DC

## 2020-11-26 MED ORDER — VALPROIC ACID 250 MG/5ML PO SOLN: ORAL | 5 refills | Status: DC

## 2020-11-26 MED ORDER — TEGRETOL 100 MG/5ML PO SUSP: ORAL | 5 refills | Status: DC

## 2020-11-26 NOTE — Progress Notes (Signed)
This is a Pediatric [facility]ist E-Visit follow up consult provided via My Chart Video Wayne Meyers and their parent/guardian consented to an E-Visit consult today.  Location of patient: Wayne Meyers is at Home(location) Location of provider: Elveria Rising, NP is at Office (location) Patient was referred by Irena Reichmann, DO   The following participants were involved in this E-Visit: Lenard Simmer, CMA              Elveria Rising, NP Total time on call: 15 min Follow up: 6 months  Patient: Wayne Meyers MRN: [id number] Sex: male DOB: Dec 28, 1990  Provider: Elveria Rising NP-C Location of Care: [facility]  Visit type: Follow up video visit  Last visit: 03/18/2020  Referral source: Irena Reichmann, MD History from: Patient's father and Epic chart  Brief history:  Copied from previous record: 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 He is taking and tolerating Depakene, Tegretol and Primidone for his seizure disorder and has remained seizure free for years. He has occasional staring spells that have not proven to be seizures.  Today's concerns: Dad reports today that Wayne Meyers has been generally healthy since his last visit other than a "stomach virus" about a week ago. Fortunately he weathered that well. Dad reports that Wayne Meyers has remained seizure free and does not miss doses of medication. Wayne Meyers sometimes has problems with constipation that Dad manages with Milk of Magnesia. He says that Wayne Meyers is currently constipated and did not sleep much last night. Dad feels that the Glycopyrrolate is constipating to [place] but that he needs it for excessive  salivation. Wayne Meyers has intermittent insomnia that Dad manages with Benadryl and Melatonin.   Wayne Meyers has been otherwise generally healthy since he was last seen. Dad has no other health concerns for him today other than previously mentioned.   Review of systems: Please see HPI for neurologic and other pertinent review of systems. Otherwise all other systems were reviewed and were negative.  Problem List: Patient Active Problem List   Diagnosis Date Noted  . Insomnia 11/27/2020  . Constipation 11/27/2020  . Drooling 11/27/2020  . 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 Myoclonus 04/11/2013  . Congenital quadriplegia (HCC) 04/11/2013  . Dysphagia, oropharyngeal phase 04/11/2013  . Neuromuscular scoliosis of lumbosacral region 04/11/2013  . 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 Cerebral palsy (HCC)   . Esophageal reflux   . Mental retardation   . Seizure (HCC)   . Seizures (HCC)    Phreesia 11/26/2020  . Vision abnormalities     Past medical history comments: See HPI Copied from previous record: Cardiac arrest in the nursery following surgical repair of gastroschisis.  EEG November 23, 2006 10 - 11 Hz dominant posterior rhythm that was normal with generalized slowing right posterior region greater than left in bursts of independent bilateral electrographic seizure activity for 30 seconds in duration of 2-3 Hz spike and slow-wave discharges equally divided between the left and right hemispheres. There is also a right temporal focus of generalized to both hemispheres. Clinical behaviors were not seen.   Scratched left eye in September  of 2015, healed without difficulty.  Fracture of left lower leg in 2016, healed without difficulty.  Surgical history: Past Surgical History:  Procedure Laterality Date  . BUTTON CHANGE N/A 04/05/2014    Procedure: BUTTON CHANGE;  Surgeon: Vertell Novak., MD;  Location: [facility];  Service: Endoscopy;  Laterality: N/A;  . BUTTON CHANGE N/A 01/09/2016   Procedure: BUTTON CHANGE;  Surgeon: Dorena Cookey, MD;  Location: [facility];  Service: Endoscopy;  Laterality: N/A;  may need sav dilators  . BUTTON CHANGE N/A 04/14/2017   Procedure: BUTTON CHANGE;  Surgeon: Kerin Salen, MD;  Location: [facility];  Service: Gastroenterology;  Laterality: N/A;  . BUTTON CHANGE N/A 10/04/2018   Procedure: BUTTON CHANGE;  Surgeon: Kerin Salen, MD;  Location: [facility];  Service: Gastroenterology;  Laterality: N/A;  . CIRCUMCISION  1990  . HERNIA REPAIR N/A    Phreesia 11/26/2020  . NISSEN FUNDOPLICATION  1986  . PEG PLACEMENT  12/07/2011   Procedure: PERCUTANEOUS ENDOSCOPIC GASTROSTOMY (PEG) REPLACEMENT;  Surgeon: Graylin Shiver, MD;  Location: [facility];  Service: Endoscopy;  Laterality: N/A;  20 french 4.4 cent.  Marland Kitchen PEG PLACEMENT N/A 01/04/2013   Procedure: PERCUTANEOUS ENDOSCOPIC GASTROSTOMY (PEG) REPLACEMENT;  Surgeon: Barrie Folk, MD;  Location: [facility];  Service: Endoscopy;  Laterality: N/A;  . PEG PLACEMENT N/A 03/19/2015   Procedure: PERCUTANEOUS ENDOSCOPIC GASTROSTOMY (PEG) REPLACEMENT;  Surgeon: Charlott Rakes, MD;  Location: [facility];  Service: Endoscopy;  Laterality: N/A;  . PEG PLACEMENT N/A 09/21/2016   Procedure: PERCUTANEOUS ENDOSCOPIC GASTROSTOMY (PEG) REPLACEMENT;  Surgeon: Carman Ching, MD;  Location: [facility];  Service: Endoscopy;  Laterality: N/A;  . PEG PLACEMENT N/A 02/13/2020   Procedure: PERCUTANEOUS ENDOSCOPIC GASTROSTOMY (PEG) REPLACEMENT;  Surgeon: Charlott Rakes, MD;  Location: [facility];  Service: Endoscopy;  Laterality: N/A;     Family history: family history includes Migraines in his mother; Other in his paternal grandfather; Seizures in his brother and mother.   Social history: Social History   Socioeconomic History  . Marital status: Single     Spouse name: Not on file  . Number of children: Not on file  . Years of education: Not on file  . Highest education level: Not on file  Occupational History  . Not on file  Tobacco Use  . Smoking status: Never Smoker  . Smokeless tobacco: Never Used  Substance and Sexual Activity  . Alcohol use: No  . Drug use: No  . Sexual activity: Never  Other Topics Concern  . Not on file  Social History Narrative   Keanu is not currently enrolled in a day program or school. He stays home and has a CNA helping with care. Dad has been having difficulty with continuous care for Stella since October 2016. Dad reports to no longer being with patient's mother since the beginning of 2015, mother moved to [place]. Issacc enjoys playing with toys. He lives with his father and brothers.     Social Determinants of Health   Financial Resource Strain: Not on file  Food Insecurity: Not on file  Transportation Needs: Not on file  Physical Activity: Not on file  Stress: Not on file  Social Connections: Not on file  Intimate Partner Violence: Not on file     Past/failed meds:  Allergies: Allergies  Allergen Reactions  . Augmentin [Amoxicillin-Pot Clavulanate] Diarrhea    Did it involve swelling of the face/tongue/throat, SOB, or low BP? No Did it involve sudden or severe rash/hives, skin peeling, or  any reaction on the inside of your mouth or nose? No Did you need to seek medical attention at a hospital or doctor's office? No When did it last happen?20+ years If all above answers are "NO", may proceed with cephalosporin use.    Marland Kitchen. Amoxicillin     Childhood allergy  . Powders [Talc] Rash    From powdered gloves     Immunizations:  There is no immunization history on file for this patient.   Diagnostics/Screenings:  Physical Exam: There were no vitals taken for this visit.  General: well developed, well nourished man, lying in bed at home, in no evident distress; sandy hair, blue  eyes, right handed Head: microcephalic and atraumatic. Oropharynx benign. No dysmorphic features. Neck: supple Musculoskeletal: left convex thoracolumbar scoliosis Skin: no rashes or neurocutaneous lesions  Neurologic Exam Mental Status: slept throughout the visit.  Motor: spastic quadriparesis with tight shoulders, bilateral elbow contractures left greater than right, contractures of the wrists and knees, clawhand deformity and tightly fisted left hand. He has subluxation of the left hip with internal/external rotation on the right and equinus deformity of both feet Sensory: withdrawal x 4 Coordination: unable to adequately assess due to patient's inability to participate in examination.  Gait and Station: unable to stand and bear weight.  Impression: Generalized convulsive epilepsy (HCC) - Plan: primidone (MYSOLINE) 250 MG tablet, TEGRETOL 100 MG/5ML suspension, valproic acid (DEPAKENE) 250 MG/5ML solution  Generalized nonconvulsive epilepsy (HCC) - Plan: primidone (MYSOLINE) 250 MG tablet, TEGRETOL 100 MG/5ML suspension, valproic acid (DEPAKENE) 250 MG/5ML solution  Congenital quadriplegia (HCC) - Plan: glycopyrrolate (ROBINUL) 1 MG tablet  Dysphagia, oropharyngeal phase - Plan: glycopyrrolate (ROBINUL) 1 MG tablet  Drooling - Plan: glycopyrrolate (ROBINUL) 1 MG tablet  Insomnia, unspecified type  Slow transit constipation  Recommendations for plan of care: [facility]. Wayne Mattendy has neither had nor required imaging or lab studies since the last visit. 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 He is taking and tolerating Primidone, Tegretol and  Valproic Acid  And has remained seizure free for years. He has problems with constipation that Dad manages with Milk of Magnesia as well as intermittent insomnia that is managed with Benadryl and Melatonin. I will make no changes to his plan of care at this time.   Return in about 6 months (around 05/28/2021).  The medication list was reviewed and reconciled. No changes were made in the prescribed medications today. A complete medication list was provided to the patient.  Allergies as of 11/26/2020      Reactions   Augmentin [amoxicillin-pot Clavulanate] Diarrhea   Did it involve swelling of the face/tongue/throat, SOB, or low BP? No Did it involve sudden or severe rash/hives, skin peeling, or any reaction on the inside of your mouth or nose? No Did you need to seek medical attention at a hospital or doctor's office? No When did it last happen?20+ years If all above answers are "NO", may proceed with cephalosporin use.   Amoxicillin    Childhood allergy   Powders [talc] Rash   From powdered gloves      Medication List       Accurate as of November 26, 2020 11:59 PM. If you have any questions, ask your nurse  or doctor.        albuterol 108 (90 Base) MCG/ACT inhaler Commonly known as: VENTOLIN HFA Inhale into the lungs every 6 (six) hours as needed for wheezing or shortness of breath.   cetirizine 10 MG tablet Commonly known as: ZYRTEC Take 10 mg by mouth daily.   chlorpheniramine 4 MG tablet Commonly known as: CHLOR-TRIMETON Place 2 mg into feeding tube 3 (three) times daily.   [employer] Melatonin 10 MG Caps Generic drug: Melatonin Take by mouth. 1/2 at night   diphenhydrAMINE HCl 50 MG/30ML Liqd Take 33 mg by mouth at bedtime.   feeding supplement (JEVITY 1.5 CAL) Liqd Place 237 mLs into feeding tube 4 (four) times daily.   glycopyrrolate 1 MG tablet Commonly known as: ROBINUL Take 1 tablet (1 mg total) by mouth 3 (three) times daily.   primidone 250 MG  tablet Commonly known as: MYSOLINE TAKE 1/2 TABLET BY MOUTH 3 TIMES A DAY   TEGretol 100 MG/5ML suspension Generic drug: carBAMazepine TAKE 15 MLS BY MOUTH 3 TIMES A DAY   valproic acid 250 MG/5ML solution Commonly known as: DEPAKENE GIVE 15 ML BY G-TUBE 3 TIMES PER DAY       Total time spent with the patient was 15 minutes, of which 50% or more was spent in counseling and coordination of care.  Elveria Rising NP-C [facility] Ph. [phone] Fax [phone]

## 2020-11-27 ENCOUNTER — Encounter (INDEPENDENT_AMBULATORY_CARE_PROVIDER_SITE_OTHER): Payer: Self-pay | Admitting: Family

## 2020-11-27 DIAGNOSIS — G4700 Insomnia, unspecified: Secondary | ICD-10-CM | POA: Insufficient documentation

## 2020-11-27 DIAGNOSIS — K5900 Constipation, unspecified: Secondary | ICD-10-CM | POA: Insufficient documentation

## 2020-11-27 DIAGNOSIS — K117 Disturbances of salivary secretion: Secondary | ICD-10-CM | POA: Insufficient documentation

## 2020-11-27 NOTE — Patient Instructions (Signed)
Thank you for meeting with me by video today.   Instructions for you until your next appointment are as follows: 1. Continue Wayne Meyers's medications as prescribed 2. Let me know if he has any seizures or if you have any concerns.  3. Please sign up for MyChart if you have not done so. 4. Please plan to return for follow up in 6 months or sooner if needed.  At [facility], we are committed to providing exceptional care. You will receive a patient satisfaction survey through text or email regarding your visit today. Your opinion is important to me. Comments are appreciated.

## 2020-12-14 ENCOUNTER — Encounter (INDEPENDENT_AMBULATORY_CARE_PROVIDER_SITE_OTHER): Payer: Self-pay

## 2021-02-13 ENCOUNTER — Other Ambulatory Visit (INDEPENDENT_AMBULATORY_CARE_PROVIDER_SITE_OTHER): Payer: Self-pay | Admitting: Family

## 2021-02-13 DIAGNOSIS — G40309 Generalized idiopathic epilepsy and epileptic syndromes, not intractable, without status epilepticus: Secondary | ICD-10-CM

## 2021-02-13 MED ORDER — PRIMIDONE 250 MG PO TABS: ORAL_TABLET | ORAL | 5 refills | Status: DC

## 2021-06-19 ENCOUNTER — Other Ambulatory Visit (INDEPENDENT_AMBULATORY_CARE_PROVIDER_SITE_OTHER): Payer: Self-pay | Admitting: Family

## 2021-06-19 DIAGNOSIS — G40309 Generalized idiopathic epilepsy and epileptic syndromes, not intractable, without status epilepticus: Secondary | ICD-10-CM

## 2021-06-27 ENCOUNTER — Other Ambulatory Visit (INDEPENDENT_AMBULATORY_CARE_PROVIDER_SITE_OTHER): Payer: Self-pay | Admitting: Family

## 2021-06-27 DIAGNOSIS — G40309 Generalized idiopathic epilepsy and epileptic syndromes, not intractable, without status epilepticus: Secondary | ICD-10-CM

## 2021-06-29 NOTE — H&P (Signed)
Chief Complaints:      Pg change/[facility]/Dr Marca Ancona.       ROS:      GI PROCEDURE:          no Pacemaker/ AICD, no.  no Artificial heart valves.  no MI/heart attack.  no Abnormal heart rhythm.  no Angina.  no CVA.  no Hypertension.  no Hypotension.  no Asthma, COPD.  no Sleep apnea. Seizure disorders YES.  no Artificial joints.  no Severe DJD.  no Diabetes.  no Significant headaches.  no Vertigo.  no Depression/anxiety.  no Abnormal bleeding.  no Kidney Disease.  no Liver disease, no.  no Chance of pregnancy.  no Blood transfusion.  no Method of Birth Control.  no Birth control pills.              Medical History: Seizures, Esophageal reflux, Mental retardation, Cerebral palsy, Vision abnormalities, Insomnia.       Surgical History: infant-- repaired tear in colon , hernia repair x2 , Nissen fundoplication , Infant-abdomen was not closed , Multiple button peg changes .       Hospitalization/Major Diagnostic Procedure: Not in the past year 01/2010-01/2011, Not in the past year 06/2021.       Family History:   Negative from GI standpoint.      Social History:      General:         Tobacco use            Tobacco history last updated 06/29/2021       no EXPOSURE TO PASSIVE SMOKE.        no Alcohol.        no Recreational drug use.        OCCUPATION: unemployed.       Medications: Taking Glycopyrrolate 1 MG Tablet 1 tablet Orally Three times daily, Taking Jevity , Notes: 4 times daily, Taking diphenhydrAMINE HCl 50 MG/30ML Liquid 15 mL at bedtime as needed Orally Once a day, Notes: 33mg , Taking Melatonin 10 MG Tablet 1/2 tablet Orally at bedtime, Taking Chlorpheniramine Maleate 4 MG Tablet Place 2mg  into feeding tube Orally Three times daily, Taking Cetirizine HCl 10 MG Tablet 1 tablet Orally Once a day, Taking Albuterol Sulfate HFA 108 (90 Base) MCG/ACT Aerosol Solution 1 puff as needed Inhalation every 6 hrs, Taking Primidone 250 MG Tablet 1/2 tablet Orally Three times a day, Taking  TEGretol(carBAMazepine) 100 MG/5ML Suspension 15 ml Orally Three times a day, Taking Valproic Acid 250 MG/5ML Syrup Orally three times a day, Discontinued Albuterol Sulfate Nebulization Solution Inhalation every 3 hours, Discontinued Pulmicort(Budesonide) Suspension nebulizatior Inhalation Twice a day, Discontinued Robinul-Forte(Glycopyrrolate) 2 MG Tablet 1 tablet Orally twice a day, Discontinued Robinul-Forte(Glycopyrrolate) 2 MG Tablet 1 tablet Orally twice a day, Medication List reviewed and reconciled with the patient      Allergies: Augmentin: upset stomach/diarrhea - Side Effects, Powder on Latex gloves: Rash - Allergy.

## 2021-06-30 ENCOUNTER — Other Ambulatory Visit: Payer: Self-pay

## 2021-06-30 ENCOUNTER — Encounter (HOSPITAL_COMMUNITY): Payer: Self-pay | Admitting: Gastroenterology

## 2021-06-30 ENCOUNTER — Encounter (HOSPITAL_COMMUNITY)
Admission: RE | Disposition: A | Discharge status: Home / Self Care | Payer: Self-pay | Source: Home / Self Care | Attending: Gastroenterology

## 2021-06-30 ENCOUNTER — Ambulatory Visit (HOSPITAL_COMMUNITY)
Admission: RE | Admit: 2021-06-30 | Discharge: 2021-06-30 | Disposition: A | Discharge status: Home / Self Care | Payer: Medicaid Other | Attending: Gastroenterology | Admitting: Gastroenterology

## 2021-06-30 DIAGNOSIS — Z431 Encounter for attention to gastrostomy: Secondary | ICD-10-CM | POA: Insufficient documentation

## 2021-06-30 HISTORY — PX: BUTTON CHANGE: SHX5423

## 2021-06-30 SURGERY — REPLACEMENT, BUTTON GASTROSTOMY DEVICE

## 2021-07-06 NOTE — H&P (Signed)
Patient was at [facility] endoscopy for PEG tube change at bedside. NO anesthesia given. No IV lines placed. Bed bound, not in respiratory distress, normal heart sounds. PEG Tube removed and a new PEG tube placed.

## 2021-07-13 ENCOUNTER — Encounter (HOSPITAL_COMMUNITY): Payer: Self-pay | Admitting: Gastroenterology

## 2021-09-16 ENCOUNTER — Other Ambulatory Visit (INDEPENDENT_AMBULATORY_CARE_PROVIDER_SITE_OTHER): Payer: Self-pay | Admitting: Family

## 2021-09-16 DIAGNOSIS — R1312 Dysphagia, oropharyngeal phase: Secondary | ICD-10-CM

## 2021-09-16 DIAGNOSIS — G808 Other cerebral palsy: Secondary | ICD-10-CM

## 2021-09-16 DIAGNOSIS — K117 Disturbances of salivary secretion: Secondary | ICD-10-CM

## 2021-09-17 ENCOUNTER — Other Ambulatory Visit (INDEPENDENT_AMBULATORY_CARE_PROVIDER_SITE_OTHER): Payer: Self-pay | Admitting: Family

## 2021-09-17 DIAGNOSIS — K117 Disturbances of salivary secretion: Secondary | ICD-10-CM

## 2021-09-17 DIAGNOSIS — G808 Other cerebral palsy: Secondary | ICD-10-CM

## 2021-09-17 DIAGNOSIS — R1312 Dysphagia, oropharyngeal phase: Secondary | ICD-10-CM

## 2021-09-17 MED ORDER — GLYCOPYRROLATE 1 MG PO TABS: 1.0000 mg | ORAL_TABLET | Freq: Three times a day (TID) | ORAL | 5 refills | Status: DC

## 2021-11-24 ENCOUNTER — Encounter (INDEPENDENT_AMBULATORY_CARE_PROVIDER_SITE_OTHER): Payer: Self-pay | Admitting: Family

## 2021-11-24 ENCOUNTER — Ambulatory Visit (INDEPENDENT_AMBULATORY_CARE_PROVIDER_SITE_OTHER): Payer: Medicaid Other | Admitting: Family

## 2021-11-24 VITALS — HR 80 | Wt 178.6 lb

## 2021-11-24 DIAGNOSIS — G808 Other cerebral palsy: Secondary | ICD-10-CM

## 2021-11-24 DIAGNOSIS — R1312 Dysphagia, oropharyngeal phase: Secondary | ICD-10-CM

## 2021-11-24 DIAGNOSIS — M4147 Neuromuscular scoliosis, lumbosacral region: Secondary | ICD-10-CM

## 2021-11-24 DIAGNOSIS — H47619 Cortical blindness, unspecified side of brain: Secondary | ICD-10-CM

## 2021-11-24 DIAGNOSIS — K117 Disturbances of salivary secretion: Secondary | ICD-10-CM

## 2021-11-24 DIAGNOSIS — G40309 Generalized idiopathic epilepsy and epileptic syndromes, not intractable, without status epilepticus: Secondary | ICD-10-CM | POA: Diagnosis not present

## 2021-11-26 MED ORDER — CARBAMAZEPINE 100 MG/5ML PO SUSP: ORAL | 5 refills | Status: DC

## 2021-11-26 MED ORDER — VALPROIC ACID 250 MG/5ML PO SOLN: ORAL | 5 refills | Status: DC

## 2021-11-26 MED ORDER — GLYCOPYRROLATE 1 MG PO TABS: 1.0000 mg | ORAL_TABLET | Freq: Three times a day (TID) | ORAL | 5 refills | Status: DC

## 2021-11-26 MED ORDER — PRIMIDONE 250 MG PO TABS: ORAL_TABLET | ORAL | 5 refills | Status: DC

## 2021-11-26 NOTE — Patient Instructions (Signed)
It was a pleasure to see you today! ? ?Instructions for you until your next appointment are as follows: ?I have ordered an EEG for Wayne Meyers. Depending what that shows, we may consider tapering one of his seizure medicines. ?I will call you when I receive the EEG report.  ?Continue to give the seizure medicines as prescribed for now ?Please sign up for MyChart if you have not done so. ?Please plan to return for follow up in one year or sooner if needed. ?  ?Feel free to contact our office during normal business hours at [phone] with questions or concerns. If there is no answer or the call is outside business hours, please leave a message and our clinic staff will call you back within the next business day.  If you have an urgent concern, please stay on the line for our after-hours answering service and ask for the on-call neurologist.   ?  ?I also encourage you to use MyChart to communicate with me more directly. If you have not yet signed up for MyChart within [facility], the front desk staff can help you. However, please note that this inbox is NOT monitored on nights or weekends, and response can take up to 2 business days.  Urgent matters should be discussed with the on-call pediatric neurologist.  ? ?At [facility], we are committed to providing exceptional care. You will receive a patient satisfaction survey through text or email regarding your visit today. Your opinion is important to me. Comments are appreciated.   ?

## 2021-11-26 NOTE — Progress Notes (Signed)
? ?Wayne Meyers   ?MRN:  [id number]  ?1990/08/18  ? ?Provider: Elveria Risingina Hedda Crumbley NP-C ?Location of Care: [facility] ? ?Visit type: Return visit ? ?Last visit: 11/26/2020 ? ?Referral source: Irena Reichmannana Collins, MD ?History from: Epic chart and patient's father ? 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 He is taking and tolerating Depakene, Tegretol and Primidone for his seizure disorder and has remained seizure free for years. He has occasional staring spells that have not proven to be seizures.  ? ?Today's concerns: ?Dad reports today that Wayne Meyers has been doing well since his last visit. He has remained seizure free and Dad wonders if we could reduce the seizure medicine doses.  ? ?Wayne Meyers has intermittent respiratory wheezing that responds well to nebulizer treatments. He does not typically have cyanosis or increased work of breathing.  ? ?Wayne Meyers has been otherwise generally healthy since he was last seen. Dad has no other health concerns for him today other than previously mentioned. ? ?Review of systems: ?Please see HPI for neurologic and other pertinent review of systems. Otherwise all other systems were reviewed and were negative. ? ?Problem List: ?Patient Active Problem List  ? Diagnosis Date Noted  ? Insomnia 11/27/2020  ? Constipation 11/27/2020  ? Drooling 11/27/2020  ? 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 Myoclonus 04/11/2013  ? Congenital quadriplegia (HCC) 04/11/2013  ? Dysphagia, oropharyngeal phase 04/11/2013  ? 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  ? ?Past Medical History:  ?Diagnosis Date  ? Cerebral palsy (HCC)   ? Esophageal reflux   ? Mental retardation   ? Seizure (HCC)   ? Seizures (HCC)   ? Phreesia 11/26/2020  ? Vision abnormalities   ?  ?Past medical history comments: See HPI ?Copied from previous record: ?Cardiac arrest in the nursery following surgical repair of gastroschisis. ?  ?EEG November 23, 2006 10 - 11 Hz dominant posterior rhythm that was normal with generalized slowing right posterior region greater than left in bursts of independent bilateral electrographic seizure activity for 30 seconds in duration of 2-3 Hz spike and slow-wave discharges equally divided between the left and right hemispheres.  There is also a right temporal focus of generalized to both hemispheres.  Clinical behaviors were not seen.   ?  ?Scratched left eye in September of 2015, healed without difficulty. ?  ?Surgical history: ?Past Surgical History:  ?Procedure Laterality Date  ? BUTTON CHANGE N/A 04/05/2014  ? Procedure: BUTTON CHANGE;  Surgeon: Vertell NovakJames L Edwards Jr., MD;  [facility];  Service: Endoscopy;  Laterality: N/A;  ? BUTTON CHANGE N/A 01/09/2016  ? Procedure: BUTTON CHANGE;  Surgeon: Dorena CookeyJohn Hayes, MD;  Location: [facility];  Service: Endoscopy;  Laterality: N/A;  may need sav dilators  ? BUTTON CHANGE N/A 04/14/2017  ? Procedure: BUTTON CHANGE;  Surgeon: Kerin SalenKarki, Arya, MD;  [facility];  Service: Gastroenterology;  Laterality: N/A;  ? BUTTON CHANGE N/A 10/04/2018  ? Procedure: BUTTON CHANGE;  Surgeon: Kerin SalenKarki, Arya, MD;  [facility];  Service: Gastroenterology;  Laterality: N/A;  ? BUTTON CHANGE  N/A 06/30/2021  ? Procedure: BUTTON CHANGE;  Surgeon: Kerin Salen, MD;  [facility];  Service: Gastroenterology;  Laterality: N/A;  ? CIRCUMCISION  1990  ? HERNIA REPAIR N/A   ? Phreesia 11/26/2020  ? NISSEN FUNDOPLICATION  1986   ? PEG PLACEMENT  12/07/2011  ? Procedure: PERCUTANEOUS ENDOSCOPIC GASTROSTOMY (PEG) REPLACEMENT;  Surgeon: Graylin Shiver, MD;  Location: [facility];  Service: Endoscopy;  Laterality: N/A;  20 french 4.4 cent.  ? PEG PLACEMENT N/A 01/04/2013  ? Procedure: PERCUTANEOUS ENDOSCOPIC GASTROSTOMY (PEG) REPLACEMENT;  Surgeon: Barrie Folk, MD;  Location: [facility];  Service: Endoscopy;  Laterality: N/A;  ? PEG PLACEMENT N/A 03/19/2015  ? Procedure: PERCUTANEOUS ENDOSCOPIC GASTROSTOMY (PEG) REPLACEMENT;  Surgeon: Charlott Rakes, MD;  Location: [facility];  Service: Endoscopy;  Laterality: N/A;  ? PEG PLACEMENT N/A 09/21/2016  ? Procedure: PERCUTANEOUS ENDOSCOPIC GASTROSTOMY (PEG) REPLACEMENT;  Surgeon: Carman Ching, MD;  Location: [facility];  Service: Endoscopy;  Laterality: N/A;  ? PEG PLACEMENT N/A 02/13/2020  ? Procedure: PERCUTANEOUS ENDOSCOPIC GASTROSTOMY (PEG) REPLACEMENT;  Surgeon: Charlott Rakes, MD;  Location: [facility];  Service: Endoscopy;  Laterality: N/A;  ?  ? ?Family history: ?family history includes Migraines in his mother; Other in his paternal grandfather; Seizures in his brother and mother.  ? ?Social history: ?Social History  ? ?Socioeconomic History  ? Marital status: Single  ?  Spouse name: Not on file  ? Number of children: Not on file  ? Years of education: Not on file  ? Highest education level: Not on file  ?Occupational History  ? Not on file  ?Tobacco Use  ? Smoking status: Never  ? Smokeless tobacco: Never  ?Substance and Sexual Activity  ? Alcohol use: No  ? Drug use: No  ? Sexual activity: Never  ?Other Topics Concern  ? Not on file  ?Social History Narrative  ? Wayne Meyers is not currently enrolled in a day program or school. He stays home and has a CNA helping with care. Dad has been having difficulty with continuous care for Conlin since October 2016. Dad reports to no longer being with patient's mother since the beginning of 2015, mother moved to [place]. Eliceo enjoys playing  with toys. He lives with his father and brothers.    ? ?Social Determinants of Health  ? ?Financial Resource Strain: Not on file  ?Food Insecurity: Not on file  ?Transportation Needs: Not on file  ?Physical Activity: Not on file  ?Stress: Not on file  ?Social Connections: Not on file  ?Intimate Partner Violence: Not on file  ?  ?Past/failed meds: ? ?Allergies: ?Allergies  ?Allergen Reactions  ? Augmentin [Amoxicillin-Pot Clavulanate] Diarrhea  ?  Did it involve swelling of the face/tongue/throat, SOB, or low BP? No ?Did it involve sudden or severe rash/hives, skin peeling, or any reaction on the inside of your mouth or nose? No ?Did you need to seek medical attention at a hospital or doctor's office? No ?When did it last happen?      20+ years ?If all above answers are ?NO?, may proceed with cephalosporin use. ? ?  ? Amoxicillin   ?  Childhood allergy  ? Latex Rash  ? Powders [Talc] Rash  ?  From powdered gloves  ?  ?Immunizations: ? ?There is no immunization history on file for this patient.  ? ?Diagnostics/Screenings: ? ?Physical Exam: ?Pulse 80   Wt 178 lb 9.6 oz (81 kg) Comment: Pt. in Wheelchair  ?General: well developed, well nourished man, seated  in wheelchair, in no evident distress ?Head: microcephalic and atraumatic. Oropharynx benign. No dysmorphic features. ?Neck: supple ?Cardiovascular: regular rate and rhythm, no murmurs. ?Respiratory: clear to auscultation bilaterally ?Abdomen: bowel sounds present all four quadrants, abdomen soft, non-tender, non-distended. No hepatosplenomegaly or masses palpated.Gastrostomy tube in place ?Musculoskeletal: left convex thoracolumbar scoliosis with increased tone in the extremities ?Skin: no rashes or neurocutaneous lesions ? ?Neurologic Exam ?Mental Status: awake and fully alert. Has no language.  Smiles responsively. Unable to follow commands or participate in examination ?Cranial Nerves: fundoscopic exam - red reflex present.  Unable to fully visualize fundus.   Pupils equal briskly reactive to light.  Turns to localize faces and objects in the periphery. Turns to localize sounds in the periphery. Facial movements are asymmetric, has lower facial weakness with drooling.

## 2021-11-27 ENCOUNTER — Encounter (INDEPENDENT_AMBULATORY_CARE_PROVIDER_SITE_OTHER): Payer: Self-pay | Admitting: Family

## 2021-12-15 ENCOUNTER — Ambulatory Visit (INDEPENDENT_AMBULATORY_CARE_PROVIDER_SITE_OTHER): Payer: Medicaid Other | Admitting: Pediatrics

## 2021-12-15 DIAGNOSIS — G40309 Generalized idiopathic epilepsy and epileptic syndromes, not intractable, without status epilepticus: Secondary | ICD-10-CM

## 2021-12-15 NOTE — Progress Notes (Signed)
OP EEG completed at CN office, results pending. 

## 2021-12-20 NOTE — Progress Notes (Signed)
Patient: Wayne Meyers MRN: [id number] ?Sex: male DOB: May 30, 1991 ? ?Clinical History: ?Glendell is a 31 y.o. 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 He is taking and tolerating Primidone, Tegretol and Valproic Acid and has remained seizure free for years. Dad is interested in reducing his medication regimen. EEG before attempting to reduce any doses. ? ?Medications: ?Primidone, carbamazepine, depakote.  ? ?Procedure: ?The tracing is carried out on a 32-channel digital Natus recorder, reformatted into 16-channel montages with 1 devoted to EKG.  The patient was awake during the recording.  The international 10/20 system lead placement used.  Recording time 32 minutes.  ? ?Description of Findings: ?Background rhythm is composed of mixed amplitude and frequency. Throughout the recording there was saw tooth -like waves at frequency of 8 Hz and 55 microvolt.  Unclear if this may represent posterior dominant rythym. Background was well organized, continuous and fairly symmetric with no focal slowing. ? ?Drowsiness and sleep were not seen during this recording.  ? ?The beginning of the recording there was frequent tooth grinding that limited the evaluation of the recording, this improved in the second half. There were also occasional muscle and blinking artifacts noted. ? ?Hyperventilation and photic stimulation were not completed due to patient status.  ? ?Throughout the recording there were no focal or generalized epileptiform activities in the form of spikes or sharps noted. There were no transient rhythmic activities or electrographic seizures noted. ? ?One lead EKG rhythm strip revealed sinus rhythm at a rate of 90 bpm. ? ?Impression: ?This is a abnormal record with the patient in the awake state due to  an unusual background activity, however no focal slowing and no clear epileptic activity.  Recommend weaning medication as clinically indicated.  ? ?Lorenz Coaster MD MPH ? ?

## 2021-12-31 ENCOUNTER — Telehealth (INDEPENDENT_AMBULATORY_CARE_PROVIDER_SITE_OTHER): Payer: Self-pay | Admitting: Family

## 2021-12-31 DIAGNOSIS — G40309 Generalized idiopathic epilepsy and epileptic syndromes, not intractable, without status epilepticus: Secondary | ICD-10-CM

## 2021-12-31 MED ORDER — CARBAMAZEPINE 100 MG/5ML PO SUSP: ORAL | 5 refills | Status: DC

## 2021-12-31 NOTE — Telephone Encounter (Signed)
Dad contacted me regarding the EEG done earlier this month. I reviewed the results with him and recommended weaning the Mysoline as follows:  Week #1 and 2 - reduce dose to 1/2 tablet twice day Week #3 and 4 - reduce dose to 1/2 tablet once per day Week #5 - stop the Mysoline.   Dad also requested a refill of Tegretol, which I will do. TG

## 2022-01-08 ENCOUNTER — Other Ambulatory Visit (INDEPENDENT_AMBULATORY_CARE_PROVIDER_SITE_OTHER): Payer: Self-pay | Admitting: Family

## 2022-01-08 DIAGNOSIS — G40309 Generalized idiopathic epilepsy and epileptic syndromes, not intractable, without status epilepticus: Secondary | ICD-10-CM

## 2022-06-23 ENCOUNTER — Other Ambulatory Visit (INDEPENDENT_AMBULATORY_CARE_PROVIDER_SITE_OTHER): Payer: Self-pay | Admitting: Family

## 2022-06-23 DIAGNOSIS — G40309 Generalized idiopathic epilepsy and epileptic syndromes, not intractable, without status epilepticus: Secondary | ICD-10-CM

## 2022-09-23 ENCOUNTER — Other Ambulatory Visit (INDEPENDENT_AMBULATORY_CARE_PROVIDER_SITE_OTHER): Payer: Self-pay | Admitting: Family

## 2022-09-23 DIAGNOSIS — R1312 Dysphagia, oropharyngeal phase: Secondary | ICD-10-CM

## 2022-09-23 DIAGNOSIS — G808 Other cerebral palsy: Secondary | ICD-10-CM

## 2022-09-23 DIAGNOSIS — K117 Disturbances of salivary secretion: Secondary | ICD-10-CM

## 2022-10-11 NOTE — H&P (Signed)
Reason for Appointment 1. Medical hx update/button peg change/[facility]/Dr Therisa Doyne  Current Medications Taking Glycopyrrolate 1 MG Tablet 1 tablet Orally Three times daily Jevity , Notes: 4 times daily diphenhydrAMINE HCl 50 MG/30ML Liquid 15 mL at bedtime as needed Orally Once a day, Notes: '33mg'$  Melatonin 10 MG Tablet 1/2 tablet Orally at bedtime Chlorpheniramine Maleate 4 MG Tablet Place '2mg'$  into feeding tube Orally Three times daily Cetirizine HCl 10 MG Tablet 1 tablet Orally Once a day Albuterol Sulfate HFA 108 (90 Base) MCG/ACT Aerosol Solution 1 puff as needed Inhalation every 6 hrs Primidone 250 MG Tablet 1/2 tablet Orally Three times a day TEGretol(carBAMazepine) 100 MG/5ML Suspension 15 ml Orally Three times a day Valproic Acid 250 MG/5ML Syrup 15ML Orally three times a day Discontinued Albuterol Sulfate Nebulization Solution Inhalation every 3 hours Pulmicort(Budesonide) Suspension nebulizatior Inhalation Twice a day Robinul-Forte(Glycopyrrolate) 2 MG Tablet 1 tablet Orally twice a day Robinul-Forte(Glycopyrrolate) 2 MG Tablet 1 tablet Orally twice a day Medication List reviewed and reconciled with the patient Past Medical History      Seizures.      Esophageal reflux.      Mental retardation.      Cerebral palsy.      Vision abnormalities.      Insomnia. Surgical History       infant-- repaired tear in colon       hernia repair x2       Nissen fundoplication       Infant-abdomen was not closed       Multiple button peg changes Family History Negative from GI standpoint. Social History General:  Tobacco use      Tobacco history last updated  06/29/2021 no EXPOSURE TO PASSIVE SMOKE. no Alcohol. no Recreational drug use. OCCUPATION: unemployed. Allergies Augmentin: upset stomach/diarrhea - Side Effects Powder on Latex gloves: Rash - Allergy Hospitalization/Major Diagnostic Procedure Not in the past year 01/2010-01/2011 Not in the past year 06/2021 Review of Systems GI  PROCEDURE:         no Pacemaker/ AICD, no.  no Artificial heart valves.  no MI/heart attack.  no Abnormal heart rhythm.  no Angina.  no CVA.  no Hypertension.  no Hypotension.  no Asthma, COPD.  no Sleep apnea.  Seizure disorders YES.  no Artificial joints.  no Severe DJD.  no Diabetes.  no Significant headaches.  no Vertigo.  no Depression/anxiety.  no Abnormal bleeding.  no Kidney Disease.  no Liver disease, no.  no Chance of pregnancy.  no Blood transfusion.  no Method of Birth Control.  no Birth control pills

## 2022-10-12 ENCOUNTER — Encounter (HOSPITAL_COMMUNITY)
Admission: RE | Disposition: A | Discharge status: Home / Self Care | Payer: Self-pay | Source: Home / Self Care | Attending: Gastroenterology

## 2022-10-12 ENCOUNTER — Ambulatory Visit (HOSPITAL_COMMUNITY)
Admission: RE | Admit: 2022-10-12 | Discharge: 2022-10-12 | Disposition: A | Discharge status: Home / Self Care | Payer: Medicaid Other | Attending: Gastroenterology | Admitting: Gastroenterology

## 2022-10-12 DIAGNOSIS — Z431 Encounter for attention to gastrostomy: Secondary | ICD-10-CM | POA: Insufficient documentation

## 2022-10-12 HISTORY — PX: PEG PLACEMENT: SHX5437

## 2022-10-12 SURGERY — REPLACEMENT, PEG TUBE, WITHOUT ENDOSCOPY
Anesthesia: Topical

## 2022-10-17 ENCOUNTER — Encounter (HOSPITAL_COMMUNITY): Payer: Self-pay | Admitting: Gastroenterology

## 2022-11-09 NOTE — Procedures (Signed)
32 year old male underwent a bedside feeding tube replacement.  This was done without sedation, in presence of patient's father and nurse.  Old PEG tube was removed. New PEG tube was immediately reinserted without complications. The patient tolerated the procedure well. Instructions for PEG tube care were given to the father.

## 2022-11-22 ENCOUNTER — Other Ambulatory Visit (INDEPENDENT_AMBULATORY_CARE_PROVIDER_SITE_OTHER): Payer: Self-pay | Admitting: Family

## 2022-11-22 DIAGNOSIS — G40309 Generalized idiopathic epilepsy and epileptic syndromes, not intractable, without status epilepticus: Secondary | ICD-10-CM

## 2022-11-29 ENCOUNTER — Encounter (INDEPENDENT_AMBULATORY_CARE_PROVIDER_SITE_OTHER): Payer: Self-pay | Admitting: Family

## 2022-11-29 ENCOUNTER — Ambulatory Visit (INDEPENDENT_AMBULATORY_CARE_PROVIDER_SITE_OTHER): Payer: Medicaid Other | Admitting: Family

## 2022-11-29 VITALS — HR 88 | Resp 16 | Wt 137.0 lb

## 2022-11-29 DIAGNOSIS — G808 Other cerebral palsy: Secondary | ICD-10-CM

## 2022-11-29 DIAGNOSIS — R1312 Dysphagia, oropharyngeal phase: Secondary | ICD-10-CM

## 2022-11-29 DIAGNOSIS — G40309 Generalized idiopathic epilepsy and epileptic syndromes, not intractable, without status epilepticus: Secondary | ICD-10-CM

## 2022-11-29 DIAGNOSIS — M4147 Neuromuscular scoliosis, lumbosacral region: Secondary | ICD-10-CM

## 2022-11-29 DIAGNOSIS — H47619 Cortical blindness, unspecified side of brain: Secondary | ICD-10-CM

## 2022-11-29 DIAGNOSIS — G4701 Insomnia due to medical condition: Secondary | ICD-10-CM

## 2022-11-29 DIAGNOSIS — G253 Myoclonus: Secondary | ICD-10-CM

## 2022-11-29 DIAGNOSIS — K117 Disturbances of salivary secretion: Secondary | ICD-10-CM

## 2022-11-29 MED ORDER — VALPROIC ACID 250 MG/5ML PO SOLN: ORAL | 5 refills | Status: DC

## 2022-11-29 MED ORDER — CARBAMAZEPINE 100 MG/5ML PO SUSP: ORAL | 5 refills | Status: DC

## 2022-11-29 MED ORDER — GLYCOPYRROLATE 1 MG PO TABS: 1.0000 mg | ORAL_TABLET | Freq: Three times a day (TID) | ORAL | 5 refills | Status: DC

## 2022-11-29 MED ORDER — PRIMIDONE 250 MG PO TABS: ORAL_TABLET | ORAL | 5 refills | Status: DC

## 2022-12-01 NOTE — Patient Instructions (Signed)
It was a pleasure to see you today!  Instructions for you until your next appointment are as follows: Continue giving Andy's medications as prescribed Let me know if he has seizures or if you have any concerns Please sign up for MyChart if you have not done so. Please plan to return for follow up in one year or sooner if needed.   Feel free to contact our office during normal business hours at [phone] with questions or concerns. If there is no answer or the call is outside business hours, please leave a message and our clinic staff will call you back within the next business day.  If you have an urgent concern, please stay on the line for our after-hours answering service and ask for the on-call neurologist.     I also encourage you to use MyChart to communicate with me more directly. If you have not yet signed up for MyChart within [facility], the front desk staff can help you. However, please note that this inbox is NOT monitored on nights or weekends, and response can take up to 2 business days.  Urgent matters should be discussed with the on-call pediatric neurologist.   At [facility], we are committed to providing exceptional care. You will receive a patient satisfaction survey through text or email regarding your visit today. Your opinion is important to me. Comments are appreciated.

## 2022-12-01 NOTE — Progress Notes (Unsigned)
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 He is taking and tolerating Depakene, Tegretol and Primidone for his seizure disorder and has remained seizure free for years. He has occasional staring spells that have not proven to be seizures.    Due to his medical condition, he is indefinitely incontinent of stool and urine.  It is medically necessary for him to use diapers, underpads, and gloves to assist with hygiene and skin integrity.     Today's concerns: Has remained seizure free since last visit Had a skin rash in March but it has not recurred No new equipment needs today Wayne Meyers has been otherwise generally healthy since he was last seen. No health concerns today other than previously mentioned.  Review of systems: Please see HPI for neurologic and other pertinent review of systems. Otherwise all other systems were reviewed and were negative.  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   EEG November 23, 2006 10 - 11 Hz dominant posterior rhythm that was normal with generalized slowing right posterior region greater than left in bursts of independent bilateral electrographic seizure activity for 30 seconds in duration of 2-3 Hz spike and slow-wave discharges equally divided between the left and right hemispheres.  There is also a right temporal focus of generalized to both hemispheres.  Clinical behaviors were not seen.     Scratched left eye in September of 2015, healed without difficulty.    Surgical history: Past Surgical History:  Procedure Laterality Date   BUTTON CHANGE N/A 04/05/2014   Procedure: BUTTON CHANGE;  Surgeon: Wayne Novak., MD;  Location: [facility];  Service: Endoscopy;  Laterality: N/A;   BUTTON CHANGE N/A 01/09/2016   Procedure: BUTTON CHANGE;  Surgeon: Wayne Cookey, MD;  Location: [facility];  Service: Endoscopy;  Laterality: N/A;  may need sav dilators   BUTTON CHANGE N/A 04/14/2017   Procedure: BUTTON CHANGE;  Surgeon: Wayne Salen, MD;  Location: [facility];  Service: Gastroenterology;  Laterality: N/A;   BUTTON CHANGE N/A 10/04/2018   Procedure: BUTTON CHANGE;  Surgeon: Wayne Salen, MD;  Location: [facility];  Service: Gastroenterology;  Laterality: N/A;   BUTTON  CHANGE N/A 06/30/2021   Procedure: BUTTON CHANGE;  Surgeon: Wayne Salen, MD;  Location: [facility];  Service: Gastroenterology;  Laterality: N/A;   CIRCUMCISION  1990   HERNIA REPAIR N/A    Phreesia 11/26/2020   NISSEN FUNDOPLICATION  1986   PEG PLACEMENT   12/07/2011   Procedure: PERCUTANEOUS ENDOSCOPIC GASTROSTOMY (PEG) REPLACEMENT;  Surgeon: Wayne Shiver, MD;  Location: [facility];  Service: Endoscopy;  Laterality: N/A;  20 french 4.4 cent.   PEG PLACEMENT N/A 01/04/2013   Procedure: PERCUTANEOUS ENDOSCOPIC GASTROSTOMY (PEG) REPLACEMENT;  Surgeon: Wayne Folk, MD;  Location[facility];  Service: Endoscopy;  Laterality: N/A;   PEG PLACEMENT N/A 03/19/2015   Procedure: PERCUTANEOUS ENDOSCOPIC GASTROSTOMY (PEG) REPLACEMENT;  Surgeon: Wayne Rakes, MD;  Location: [facility] ENDOSCOPY;  Service: Endoscopy;  Laterality: N/A;   PEG PLACEMENT N/A 09/21/2016   Procedure: PERCUTANEOUS ENDOSCOPIC GASTROSTOMY (PEG) REPLACEMENT;  Surgeon: Wayne Ching, MD;  Location: [facility];  Service: Endoscopy;  Laterality: N/A;   PEG PLACEMENT N/A 02/13/2020   Procedure: PERCUTANEOUS ENDOSCOPIC GASTROSTOMY (PEG) REPLACEMENT;  Surgeon: Wayne Rakes, MD;  Location: [facility];  Service: Endoscopy;  Laterality: N/A;   PEG PLACEMENT N/A 10/12/2022   Procedure: PERCUTANEOUS ENDOSCOPIC GASTROSTOMY (PEG) REPLACEMENT;  Surgeon: Wayne Salen, MD;  Location: [facility];  Service: Gastroenterology;  Laterality: N/A;     Family history: family history includes Migraines in his mother; Other in his paternal grandfather; Seizures in his brother and mother.   Social history: Social History   Socioeconomic History   Marital status: Single    Spouse name: Not on file   Number of children: Not on file   Years of education: Not on file   Highest education level: Not on file  Occupational History   Not on file  Tobacco Use   Smoking status: Never   Smokeless tobacco: Never  Substance and Sexual Activity   Alcohol use: No   Drug use: No   Sexual activity: Never  Other Topics Concern   Not on file  Social History Narrative   Wayne Meyers is not currently enrolled in a day program or school. He stays home and has a CNA helping with care. Dad has been having difficulty with  continuous care for Wayne Meyers since October 2016. Dad reports to no longer being with patient's mother since the beginning of 2015, mother moved to [place]. Charbel enjoys playing with toys. He lives with his father and brothers.     Social Determinants of Health   Financial Resource Strain: Not on file  Food Insecurity: Not on file  Transportation Needs: Not on file  Physical Activity: Not on file  Stress: Not on file  Social Connections: Not on file  Intimate Partner Violence: Not on file    Past/failed meds:  Allergies: Allergies  Allergen Reactions   Augmentin [Amoxicillin-Pot Clavulanate] Diarrhea    Did it involve swelling of the face/tongue/throat, SOB, or low BP? No Did it involve sudden or severe rash/hives, skin peeling, or any reaction on the inside of your mouth or nose? No Did you need to seek medical attention at a hospital or doctor's office? No When did it last happen?      20+ years If all above answers are "NO", may proceed with cephalosporin use.     Amoxicillin     Childhood allergy   Latex Rash   Powders [Talc] Rash    From powdered gloves    Immunizations:  There is no immunization history on file for this  patient.   Diagnostics/Screenings:  Physical Exam: Pulse 88   Resp 16   Wt 137 lb (62.1 kg)   General: well developed, well nourished, seated, in no evident distress Head: normocephalic and atraumatic. Oropharynx benign. No dysmorphic features. Neck: supple Cardiovascular: regular rate and rhythm, no murmurs. Respiratory: clear to auscultation bilaterally Abdomen: bowel sounds present all four quadrants, abdomen soft, non-tender, non-distended. No hepatosplenomegaly or masses palpated.Gastrostomy tube in place size *** Musculoskeletal: no skeletal deformities or obvious scoliosis. Has contractures**** Skin: no rashes or neurocutaneous lesions  Neurologic Exam Mental Status: awake and fully alert. Has no language.  Smiles responsively. Resistant  to invasions into ***space Cranial Nerves: fundoscopic exam - red reflex present.  Unable to fully visualize fundus.  Pupils equal briskly reactive to light.  Turns to localize faces and objects in the periphery. Turns to localize sounds in the periphery. Facial movements are asymmetric, has lower facial weakness with drooling.  Neck flexion and extension *** abnormal with poor head control.  Motor: truncal hypotonia.  *** spastic quadriparesis  Sensory: withdrawal x 4 Coordination: unable to adequately assess due to patient's inability to participate in examination. No dysmetria when reaching for objects. Gait and Station: unable to independently stand and bear weight. Able to stand with assistance but needs constant support. Able to take a few steps but has poor balance and needs support.  Reflexes: diminished and symmetric. Toes neutral. 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 No recent diagnostic studies to be reviewed with the patient.  Plan until next visit: Continue medications as prescribed  Reminded -  Call if  Return today (on 11/29/2022).  The medication list was reviewed and reconciled. No  changes were made in the prescribed medications today. A complete medication list was provided to the patient.  No orders of the defined types were placed in this encounter.    Allergies as of 11/29/2022       Reactions   Augmentin [amoxicillin-pot Clavulanate] Diarrhea   Did it involve swelling of the face/tongue/throat, SOB, or low BP? No Did it involve sudden or severe rash/hives, skin peeling, or any reaction on the inside of your mouth or nose? No Did you need to seek medical attention at a hospital or doctor's office? No When did it last happen?      20+ years If all above answers are "NO", may proceed with cephalosporin use.   Amoxicillin    Childhood allergy   Latex Rash   Powders [talc] Rash   From powdered gloves        Medication List        Accurate as of November 29, 2022 11:59 PM. If you have any questions, ask your nurse or doctor.          albuterol 108 (90 Base) MCG/ACT inhaler Commonly known as: VENTOLIN HFA Inhale into the lungs  every 6 (six) hours as needed for wheezing or shortness of breath.   carBAMazepine 100 MG/5ML suspension Commonly known as: TEGretol TAKE 15 ML BY MOUTH 3 TIMES DAILY   cetirizine 10 MG tablet Commonly known as: ZYRTEC Take 10 mg by mouth daily.   chlorpheniramine 4 MG tablet Commonly known as: CHLOR-TRIMETON Place 2 mg into feeding tube 3 (three) times daily.   diphenhydrAMINE HCl (Sleep) 50 MG/30ML Liqd Take 33 mg by mouth at bedtime.   feeding supplement (JEVITY 1.5 CAL) Liqd Place 237 mLs into feeding tube 4 (four) times daily.   FIBER PO 1 can per g-tube feeding   FIBER PO 1 can per g-tube feeding   glycopyrrolate 1 MG tablet Commonly known as: ROBINUL Take 1 tablet (1 mg total) by mouth 3 (three) times daily.   Melatonin 10 MG Caps Take by mouth. 1/2 at night   primidone 250 MG tablet Commonly known as: MYSOLINE TAKE 1/2 TABLET BY MOUTH 3 TIMES A DAY   valproic acid 250 MG/5ML solution Commonly  known as: DEPAKENE GIVE 15 MLS BY G-TUBE 3 TIMES PER DAY            I discussed this patient's care with the multiple providers involved in his care today to develop this assessment and plan.   Total time spent with the patient was *** minutes, of which 50% or more was spent in counseling and coordination of care.  Wayne Rising NP-C [facility] [address] [place], [place] [address] Ph. [phone] Fax [phone]

## 2022-12-02 ENCOUNTER — Encounter (INDEPENDENT_AMBULATORY_CARE_PROVIDER_SITE_OTHER): Payer: Self-pay | Admitting: Family

## 2022-12-20 ENCOUNTER — Telehealth (INDEPENDENT_AMBULATORY_CARE_PROVIDER_SITE_OTHER): Payer: Self-pay

## 2022-12-20 NOTE — Telephone Encounter (Signed)
Received PA request for patients Carbamazepine. PA Process and Approved.  Effective 5.13.2024 until 5.08.2025  PA #: [id number]  SS, CCMA

## 2023-06-18 ENCOUNTER — Other Ambulatory Visit (INDEPENDENT_AMBULATORY_CARE_PROVIDER_SITE_OTHER): Payer: Self-pay | Admitting: Family

## 2023-06-18 DIAGNOSIS — G40309 Generalized idiopathic epilepsy and epileptic syndromes, not intractable, without status epilepticus: Secondary | ICD-10-CM

## 2023-06-20 NOTE — Telephone Encounter (Signed)
Last OV 11/2022 Next OV 11/29/2022 Rx 11/29/2022 5rf disp 1350 ml

## 2023-06-24 ENCOUNTER — Other Ambulatory Visit (INDEPENDENT_AMBULATORY_CARE_PROVIDER_SITE_OTHER): Payer: Self-pay | Admitting: Family

## 2023-06-24 DIAGNOSIS — G40309 Generalized idiopathic epilepsy and epileptic syndromes, not intractable, without status epilepticus: Secondary | ICD-10-CM

## 2023-06-24 MED ORDER — VALPROIC ACID 250 MG/5ML PO SOLN: ORAL | 3 refills | Status: DC

## 2023-06-24 MED ORDER — CARBAMAZEPINE 100 MG/5ML PO SUSP: ORAL | 3 refills | Status: DC

## 2023-09-13 ENCOUNTER — Other Ambulatory Visit (INDEPENDENT_AMBULATORY_CARE_PROVIDER_SITE_OTHER): Payer: Self-pay | Admitting: Family

## 2023-09-13 DIAGNOSIS — G808 Other cerebral palsy: Secondary | ICD-10-CM

## 2023-09-13 DIAGNOSIS — R1312 Dysphagia, oropharyngeal phase: Secondary | ICD-10-CM

## 2023-09-13 DIAGNOSIS — K117 Disturbances of salivary secretion: Secondary | ICD-10-CM

## 2023-11-28 NOTE — Progress Notes (Unsigned)
 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 He is taking and tolerating generic Depakene  and Tegretol  for his seizure disorder and has remained seizure free for years. He has occasional staring spells that have not proven to be seizures.     Due to his medical condition, he is indefinitely incontinent of stool and urine.  It is medically necessary for him to use diapers, underpads, and gloves to assist with hygiene and skin integrity.     Today's concerns: He has remained seizure free since his last visit Wayne Meyers is uncomfortable in the wheelchair and does best at home in his bed. He is crying and restless today in the exam room. Dad gave him Tylenol 500 mg prior to leaving his home.  Dad denies equipment needs today Wayne Meyers has been otherwise generally healthy since he was last seen. No health concerns today other than previously mentioned.  Review of systems: Please see HPI for neurologic and other pertinent review of systems. Otherwise all other systems were reviewed and were negative.  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   EEG November 23, 2006 10 - 11 Hz dominant posterior rhythm that was normal with generalized slowing right posterior region greater than left in bursts of independent bilateral electrographic seizure activity for 30 seconds in duration of 2-3 Hz spike and slow-wave discharges equally divided between the left and right hemispheres.  There is also a right temporal focus of generalized to both hemispheres.  Clinical behaviors were not seen.     Scratched left eye in September of 2015, healed without difficulty.  Surgical history: Past Surgical History:  Procedure Laterality Date   BUTTON CHANGE N/A 04/05/2014   Procedure: BUTTON CHANGE;  Surgeon: Venson Ginger., MD;  Location: [facility] END[facility];  Service: Endoscopy;  Laterality: N/A;   BUTTON CHANGE N/A 01/09/2016   Procedure: BUTTON CHANGE;  Surgeon: Delilah Fend, MD;  Location: [facility];  Service: Endoscopy;  Laterality: N/A;  may need sav dilators   BUTTON CHANGE N/A 04/14/2017   Procedure: BUTTON CHANGE;  Surgeon: Genell Ken, MD;  Location: [facility];  Service: Gastroenterology;  Laterality: N/A;   BUTTON CHANGE N/A 10/04/2018   Procedure: BUTTON CHANGE;  Surgeon: Genell Ken, MD;  Location: [facility];  Service: Gastroenterology;  Laterality: N/A;   BUTTON CHANGE N/A 06/30/2021   Procedure: BUTTON CHANGE;  Surgeon: Genell Ken, MD;  Location: [facility]  ENDOS[facility];  Service: Gastroenterology;  Laterality: N/A;   CIRCUMCISION  1990   HERNIA REPAIR N/A    Phreesia 11/26/2020   NISSEN FUNDOPLICATION  1986   PEG PLACEMENT  12/07/2011   Procedure: PERCUTANEOUS ENDOSCOPIC GASTROSTOMY (PEG) REPLACEMENT;  Surgeon: Celedonio Coil, MD;  Location: [facility];  Service: Endoscopy;  Laterality: N/A;  20 french 4.4 cent.   PEG PLACEMENT N/A 01/04/2013   Procedure: PERCUTANEOUS ENDOSCOPIC GASTROSTOMY (PEG) REPLACEMENT;  Surgeon: Barbie Boon, MD;  Location: [facility] ENDOSCOPY;  Service: Endoscopy;  Laterality: N/A;   PEG PLACEMENT N/A 03/19/2015   Procedure: PERCUTANEOUS ENDOSCOPIC GASTROSTOMY (PEG) REPLACEMENT;  Surgeon: Baldo Bonds, MD;  Location: [facility] ENDOSCOPY;  Service: Endoscopy;  Laterality: N/A;   PEG PLACEMENT N/A 09/21/2016   Procedure: PERCUTANEOUS ENDOSCOPIC GASTROSTOMY (PEG) REPLACEMENT;  Surgeon: Jolinda Necessary, MD;  Location: [facility];  Service: Endoscopy;  Laterality: N/A;   PEG PLACEMENT N/A 02/13/2020   Procedure: PERCUTANEOUS ENDOSCOPIC GASTROSTOMY (PEG) REPLACEMENT;  Surgeon: Baldo Bonds, MD;  Location: [facility];  Service: Endoscopy;  Laterality: N/A;   PEG PLACEMENT N/A 10/12/2022   Procedure: PERCUTANEOUS ENDOSCOPIC GASTROSTOMY (PEG) REPLACEMENT;  Surgeon: Genell Ken, MD;  Location: [facility];  Service: Gastroenterology;  Laterality: N/A;    Family history: family history includes Migraines in his mother; Other in his paternal grandfather; Seizures in his brother and mother.   Social history: Social History   Socioeconomic History   Marital status: Single    Spouse name: Not on file   Number of children: Not on file   Years of education: Not on file   Highest education level: Not on file  Occupational History   Not on file  Tobacco Use   Smoking status: Never   Smokeless tobacco: Never  Substance and Sexual Activity   Alcohol use: No   Drug use: No   Sexual activity: Never  Other Topics Concern   Not on file   Social History Narrative   [name] is not currently enrolled in a day program or school. He stays home and has a CNA helping with care. Dad has been having difficulty with continuous care for Anel since October 2016. Dad reports to no longer being with patient's mother since the beginning of 2015, mother moved to [place] . Stratton enjoys playing with toys. He lives with his father and brothers.     Social Drivers of Corporate investment banker Strain: Not on file  Food Insecurity: Not on file  Transportation Needs: Not on file  Physical Activity: Not on file  Stress: Not on file  Social Connections: Not on file  Intimate Partner Violence: Not on file    Past/failed meds: Primidone  - was determined to be not needed for seizure control - discontinued in 2023  Allergies: Allergies  Allergen Reactions   Augmentin [Amoxicillin-Pot Clavulanate] Diarrhea    Did it involve swelling of the face/tongue/throat, SOB, or low BP? No Did it involve sudden or severe rash/hives, skin peeling, or any reaction on the inside of your mouth or nose? No Did you need to seek medical attention at a hospital or doctor's office? No When did it last happen?      20+ years If all above answers are "NO",  may proceed with cephalosporin use.     Amoxicillin     Childhood allergy   Latex Rash   Powders [Talc] Rash    From powdered gloves    Immunizations:  There is no immunization history on file for this patient.   Diagnostics/Screenings:  Physical Exam: BP 130/80 (BP Location: Right Arm, Patient Position: Sitting, Cuff Size: Normal)   Pulse 86   General: well developed, well nourished man, seated in wheelchair, in no evident distress Head: normocephalic and atraumatic. Oropharynx difficult to examine but appears benign. No dysmorphic features. Neck: supple Cardiovascular: regular rate and rhythm, no murmurs. Respiratory: clear to auscultation bilaterally Abdomen: bowel sounds present all four  quadrants, abdomen soft, non-tender, non-distended. No hepatosplenomegaly or masses palpated.Gastrostomy tube in place, site clean and dry Musculoskeletal: no skeletal deformities. Has thoracolumbar scoliosis and increased tone in the extremities Skin: no rashes or neurocutaneous lesions  Neurologic Exam Mental Status: awake and fully alert. Has no language.  Crying and restless during the visit.  Unable to follow instructions or participate in examination Cranial Nerves: fundoscopic exam - red reflex present.  Unable to fully visualize fundus.  Pupils equal briskly reactive to light.  Turns to localize faces and objects in the periphery. Turns to localize sounds in the periphery. Facial movements are asymmetric, has lower facial weakness with drooling.  Motor: spastic quadriparesis  Sensory: withdrawal x 4 Coordination: unable to adequately assess due to patient's inability to participate in examination. Does not reach for objects. Gait and Station: unable to stand and bear weight.   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 No recent diagnostic studies to be reviewed with the patient.  Plan until next visit: Continue medications as prescribed  Call if seizures occur or for  questions or concerns Return in about 1 year (around 11/28/2024).  The medication list was reviewed and reconciled. No changes were made in the prescribed medications today. A complete medication list was provided to the patient.   Allergies as of 11/29/2023       Reactions   Augmentin [amoxicillin-pot Clavulanate] Diarrhea   Did it involve swelling of the face/tongue/throat, SOB, or low BP? No Did it involve sudden or severe rash/hives, skin peeling, or any reaction on the inside of your mouth or nose? No Did you need to seek medical attention at a hospital or doctor's office? No When did it last happen?      20+ years If all above answers are "NO", may proceed with cephalosporin use.   Amoxicillin    Childhood allergy   Latex Rash   Powders [talc] Rash   From powdered gloves        Medication List        Accurate as of November 29, 2023  1:38 PM. If you have any questions, ask your nurse or doctor.          STOP taking these medications    FIBER  PO Stopped by: Lyndol Santee   primidone  250 MG tablet Commonly known as: MYSOLINE  Stopped by: Lyndol Santee       TAKE these medications    albuterol 108 (90 Base) MCG/ACT inhaler Commonly known as: VENTOLIN HFA Inhale into the lungs every 6 (six) hours as needed for wheezing or shortness of breath.   carBAMazepine  100 MG/5ML suspension Commonly known as: TEGretol  TAKE 15 ML BY MOUTH 3 TIMES DAILY   cetirizine 10 MG tablet Commonly known as: ZYRTEC Take 10 mg by mouth daily.   chlorpheniramine 4 MG tablet Commonly known as: CHLOR-TRIMETON Place 2 mg into feeding tube 3 (three) times daily.   diphenhydrAMINE HCl (Sleep) 50 MG/30ML Liqd Take 33 mg by mouth at bedtime.   feeding supplement (JEVITY 1.5 CAL) Liqd Place 237 mLs into feeding tube 4 (four) times daily.   glycopyrrolate  1 MG tablet Commonly known as: ROBINUL  Take 1 tablet (1 mg total) by mouth 3 (three) times daily.   Melatonin 10 MG  Caps Take by mouth. 1/2 at night   valproic  acid 250 MG/5ML solution Commonly known as: DEPAKENE  GIVE 15 MLS BY G-TUBE 3 TIMES PER DAY      Total time spent with the patient was 25 minutes, of which 50% or more was spent in counseling and coordination of care.  Lyndol Santee NP-C [facility] [address] [place], [place] [address] Ph. [phone] Fax [phone]

## 2023-11-29 ENCOUNTER — Encounter (INDEPENDENT_AMBULATORY_CARE_PROVIDER_SITE_OTHER): Payer: Self-pay | Admitting: Family

## 2023-11-29 ENCOUNTER — Ambulatory Visit (INDEPENDENT_AMBULATORY_CARE_PROVIDER_SITE_OTHER): Payer: MEDICAID | Admitting: Family

## 2023-11-29 VITALS — BP 130/80 | HR 86

## 2023-11-29 DIAGNOSIS — H47619 Cortical blindness, unspecified side of brain: Secondary | ICD-10-CM

## 2023-11-29 DIAGNOSIS — M4147 Neuromuscular scoliosis, lumbosacral region: Secondary | ICD-10-CM

## 2023-11-29 DIAGNOSIS — G808 Other cerebral palsy: Secondary | ICD-10-CM

## 2023-11-29 DIAGNOSIS — G40309 Generalized idiopathic epilepsy and epileptic syndromes, not intractable, without status epilepticus: Secondary | ICD-10-CM

## 2023-11-29 DIAGNOSIS — G4701 Insomnia due to medical condition: Secondary | ICD-10-CM

## 2023-11-29 DIAGNOSIS — R1312 Dysphagia, oropharyngeal phase: Secondary | ICD-10-CM

## 2023-11-29 DIAGNOSIS — K117 Disturbances of salivary secretion: Secondary | ICD-10-CM

## 2023-11-29 MED ORDER — GLYCOPYRROLATE 1 MG PO TABS: 1.0000 mg | ORAL_TABLET | Freq: Three times a day (TID) | ORAL | 3 refills | Status: DC

## 2023-11-29 MED ORDER — CARBAMAZEPINE 100 MG/5ML PO SUSP: ORAL | 3 refills | Status: DC

## 2023-11-29 MED ORDER — VALPROIC ACID 250 MG/5ML PO SOLN: ORAL | 3 refills | Status: DC

## 2023-11-29 NOTE — Patient Instructions (Signed)
 It was a pleasure to see you today!  Instructions for you until your next appointment are as follows: Continue giving Wayne Meyers's medications as prescribed Let me know if seizures occur or if you have any other concerns Please sign up for MyChart if you have not done so. Please plan to return for follow up in 1 year or sooner if needed.  Feel free to contact our office during normal business hours at [phone] with questions or concerns. If there is no answer or the call is outside business hours, please leave a message and our clinic staff will call you back within the next business day.  If you have an urgent concern, please stay on the line for our after-hours answering service and ask for the on-call neurologist.     I also encourage you to use MyChart to communicate with me more directly. If you have not yet signed up for MyChart within [facility], the front desk staff can help you. However, please note that this inbox is NOT monitored on nights or weekends, and response can take up to 2 business days.  Urgent matters should be discussed with the on-call pediatric neurologist.   At [facility], we are committed to providing exceptional care. You will receive a patient satisfaction survey through text or email regarding your visit today. Your opinion is important to me. Comments are appreciated.

## 2024-04-11 ENCOUNTER — Ambulatory Visit (HOSPITAL_COMMUNITY)
Admission: RE | Admit: 2024-04-11 | Discharge: 2024-04-11 | Disposition: A | Discharge status: Home / Self Care | Payer: MEDICAID | Attending: Gastroenterology | Admitting: Gastroenterology

## 2024-04-11 ENCOUNTER — Other Ambulatory Visit: Payer: Self-pay

## 2024-04-11 ENCOUNTER — Encounter (HOSPITAL_COMMUNITY): Payer: Self-pay | Admitting: Gastroenterology

## 2024-04-11 ENCOUNTER — Encounter (HOSPITAL_COMMUNITY)
Admission: RE | Disposition: A | Discharge status: Home / Self Care | Payer: Self-pay | Source: Home / Self Care | Attending: Gastroenterology

## 2024-04-11 DIAGNOSIS — Z931 Gastrostomy status: Secondary | ICD-10-CM | POA: Diagnosis present

## 2024-04-11 HISTORY — PX: PEG PLACEMENT: SHX5437

## 2024-04-11 SURGERY — REPLACEMENT, PEG TUBE, WITHOUT ENDOSCOPY

## 2024-04-11 NOTE — H&P (Signed)
 Date of Initial H&P: 04/06/24  History reviewed, patient examined, no change in status, stable for surgery.

## 2024-04-11 NOTE — Progress Notes (Signed)
 Wayne Meyers presented to endoscopy today for a PEG button change with Dr Dianna. PEG button replaced by Dr Dianna, placement confirmed by flushing and gastric aspiration. Pt discharged to home post-procedurally.  Wayne VEAR Pouch, RN 04/11/24 10:40 AM

## 2024-04-11 NOTE — Brief Op Note (Signed)
 33 year old male underwent a bedside feeding tube button replacement.   This was done without sedation, in presence of patient's father and nurse.   New PEG tube balloon was checked for compliance and successfully inflated with saline. Old PEG tube was removed. New PEG tube was immediately reinserted without complications and 10 cc of sterile saline inserted into balloon. New PEG tube was flushed and was working properly. Dressing applied by father and nurse.  The patient tolerated the procedure well. Instructions for PEG tube care were given to the father.

## 2024-04-11 NOTE — Interval H&P Note (Signed)
 History and Physical Interval Note:  04/11/2024 10:23 AM  Wayne Meyers  has presented today for surgery, with the diagnosis of peg exchange.  The various methods of treatment have been discussed with the patient and family. After consideration of risks, benefits and other options for treatment, the patient has consented to  Procedure(s): INSERTION, PEG TUBE (N/A) as a surgical intervention.  The patient's history has been reviewed, patient examined, no change in status, stable for surgery.  I have reviewed the patient's chart and labs.  Questions were answered to the patient's satisfaction.     [name]

## 2024-04-15 ENCOUNTER — Encounter (HOSPITAL_COMMUNITY): Payer: Self-pay | Admitting: Gastroenterology

## 2024-08-13 ENCOUNTER — Other Ambulatory Visit: Payer: Self-pay

## 2024-08-13 ENCOUNTER — Inpatient Hospital Stay (HOSPITAL_COMMUNITY)
Admission: EM | Admit: 2024-08-13 | Discharge: 2024-09-09 | Disposition: E | Payer: MEDICAID | Attending: Pulmonary Disease | Admitting: Pulmonary Disease

## 2024-08-13 ENCOUNTER — Inpatient Hospital Stay (HOSPITAL_COMMUNITY): Payer: MEDICAID

## 2024-08-13 ENCOUNTER — Encounter (HOSPITAL_COMMUNITY): Payer: Self-pay

## 2024-08-13 ENCOUNTER — Emergency Department (HOSPITAL_COMMUNITY): Payer: MEDICAID

## 2024-08-13 DIAGNOSIS — J9601 Acute respiratory failure with hypoxia: Principal | ICD-10-CM | POA: Diagnosis present

## 2024-08-13 DIAGNOSIS — J9811 Atelectasis: Secondary | ICD-10-CM

## 2024-08-13 DIAGNOSIS — G808 Other cerebral palsy: Secondary | ICD-10-CM | POA: Diagnosis not present

## 2024-08-13 DIAGNOSIS — J101 Influenza due to other identified influenza virus with other respiratory manifestations: Secondary | ICD-10-CM | POA: Diagnosis present

## 2024-08-13 DIAGNOSIS — J09X9 Influenza due to identified novel influenza A virus with other manifestations: Secondary | ICD-10-CM | POA: Diagnosis not present

## 2024-08-13 DIAGNOSIS — G40309 Generalized idiopathic epilepsy and epileptic syndromes, not intractable, without status epilepticus: Secondary | ICD-10-CM | POA: Diagnosis not present

## 2024-08-13 DIAGNOSIS — G809 Cerebral palsy, unspecified: Secondary | ICD-10-CM

## 2024-08-13 DIAGNOSIS — J09X1 Influenza due to identified novel influenza A virus with pneumonia: Secondary | ICD-10-CM | POA: Diagnosis not present

## 2024-08-13 DIAGNOSIS — R6521 Severe sepsis with septic shock: Secondary | ICD-10-CM | POA: Diagnosis not present

## 2024-08-13 DIAGNOSIS — H47619 Cortical blindness, unspecified side of brain: Secondary | ICD-10-CM | POA: Diagnosis present

## 2024-08-13 DIAGNOSIS — K117 Disturbances of salivary secretion: Secondary | ICD-10-CM | POA: Diagnosis present

## 2024-08-13 DIAGNOSIS — T17500A Unspecified foreign body in bronchus causing asphyxiation, initial encounter: Secondary | ICD-10-CM

## 2024-08-13 DIAGNOSIS — A419 Sepsis, unspecified organism: Secondary | ICD-10-CM | POA: Diagnosis not present

## 2024-08-13 LAB — COMPREHENSIVE METABOLIC PANEL WITH GFR
ALT: 12 U/L (ref 0–44)
AST: 52 U/L — ABNORMAL HIGH (ref 15–41)
Albumin: 3.1 g/dL — ABNORMAL LOW (ref 3.5–5.0)
Alkaline Phosphatase: 78 U/L (ref 38–126)
Anion gap: 12 (ref 5–15)
BUN: 7 mg/dL (ref 6–20)
CO2: 23 mmol/L (ref 22–32)
Calcium: 8.3 mg/dL — ABNORMAL LOW (ref 8.9–10.3)
Chloride: 96 mmol/L — ABNORMAL LOW (ref 98–111)
Creatinine, Ser: 0.32 mg/dL — ABNORMAL LOW (ref 0.61–1.24)
GFR, Estimated: >60 mL/min
Glucose, Bld: 92 mg/dL (ref 70–99)
Potassium: 4.9 mmol/L (ref 3.5–5.1)
Sodium: 131 mmol/L — ABNORMAL LOW (ref 135–145)
Total Bilirubin: 0.5 mg/dL (ref 0.0–1.2)
Total Protein: 6.2 g/dL — ABNORMAL LOW (ref 6.5–8.1)

## 2024-08-13 LAB — RESP PANEL BY RT-PCR (RSV, FLU A&B, COVID)  RVPGX2
Influenza A by PCR: POSITIVE — AB
Influenza B by PCR: NEGATIVE
Resp Syncytial Virus by PCR: NEGATIVE
SARS Coronavirus 2 by RT PCR: NEGATIVE

## 2024-08-13 LAB — I-STAT CHEM 8, ED
BUN: 5 mg/dL — ABNORMAL LOW (ref 6–20)
Calcium, Ion: 1.16 mmol/L (ref 1.15–1.40)
Chloride: 92 mmol/L — ABNORMAL LOW (ref 98–111)
Creatinine, Ser: 0.3 mg/dL — ABNORMAL LOW (ref 0.61–1.24)
Glucose, Bld: 96 mg/dL (ref 70–99)
HCT: 40 % (ref 39.0–52.0)
Hemoglobin: 13.6 g/dL (ref 13.0–17.0)
Potassium: 3.8 mmol/L (ref 3.5–5.1)
Sodium: 130 mmol/L — ABNORMAL LOW (ref 135–145)
TCO2: 28 mmol/L (ref 22–32)

## 2024-08-13 LAB — CBC WITH DIFFERENTIAL/PLATELET
Abs Immature Granulocytes: 0.03 K/uL (ref 0.00–0.07)
Basophils Absolute: 0 K/uL (ref 0.0–0.1)
Basophils Relative: 0 %
Eosinophils Absolute: 0 K/uL (ref 0.0–0.5)
Eosinophils Relative: 0 %
HCT: 39.8 % (ref 39.0–52.0)
Hemoglobin: 13.3 g/dL (ref 13.0–17.0)
Immature Granulocytes: 0 %
Lymphocytes Relative: 8 %
Lymphs Abs: 1.1 K/uL (ref 0.7–4.0)
MCH: 29.8 pg (ref 26.0–34.0)
MCHC: 33.4 g/dL (ref 30.0–36.0)
MCV: 89.2 fL (ref 80.0–100.0)
Monocytes Absolute: 3.1 K/uL — ABNORMAL HIGH (ref 0.1–1.0)
Monocytes Relative: 23 %
Neutro Abs: 9.4 K/uL — ABNORMAL HIGH (ref 1.7–7.7)
Neutrophils Relative %: 69 %
Platelets: 136 K/uL — ABNORMAL LOW (ref 150–400)
RBC: 4.46 MIL/uL (ref 4.22–5.81)
RDW: 13.4 % (ref 11.5–15.5)
Smear Review: NORMAL
WBC: 13.7 K/uL — ABNORMAL HIGH (ref 4.0–10.5)
nRBC: 0 % (ref 0.0–0.2)

## 2024-08-13 LAB — BLOOD GAS, VENOUS
Acid-Base Excess: 7.6 mmol/L — ABNORMAL HIGH (ref 0.0–2.0)
Bicarbonate: 33.7 mmol/L — ABNORMAL HIGH (ref 20.0–28.0)
O2 Saturation: 84.8 %
Patient temperature: 37
pCO2, Ven: 52 mmHg (ref 44–60)
pH, Ven: 7.42 (ref 7.25–7.43)
pO2, Ven: 49 mmHg — ABNORMAL HIGH (ref 32–45)

## 2024-08-13 LAB — BLOOD GAS, ARTERIAL
Acid-Base Excess: 2.9 mmol/L — ABNORMAL HIGH (ref 0.0–2.0)
Bicarbonate: 28.5 mmol/L — ABNORMAL HIGH (ref 20.0–28.0)
Drawn by: 12234
FIO2: 100 %
MECHVT: 340 mL
O2 Saturation: 100 %
PEEP: 5 cmH2O
Patient temperature: 39.1
RATE: 22 {breaths}/min
pCO2 arterial: 52 mmHg — ABNORMAL HIGH (ref 32–48)
pH, Arterial: 7.36 (ref 7.35–7.45)
pO2, Arterial: 342 mmHg — ABNORMAL HIGH (ref 83–108)

## 2024-08-13 LAB — I-STAT CG4 LACTIC ACID, ED: Lactic Acid, Venous: 1 mmol/L (ref 0.5–1.9)

## 2024-08-13 LAB — PROTIME-INR
INR: 1.1 (ref 0.8–1.2)
Prothrombin Time: 14.9 s (ref 11.4–15.2)

## 2024-08-13 MED ORDER — SORBITOL 70 % SOLN
30.0000 mL | Freq: Every day | Status: DC | PRN
  Administered 2024-08-15 – 2024-08-20 (×3): 30 mL
  Filled 2024-08-13 (×3): qty 30

## 2024-08-13 MED ORDER — KETAMINE HCL 50 MG/5ML IJ SOSY
PREFILLED_SYRINGE | INTRAMUSCULAR | Status: AC
  Filled 2024-08-13: qty 10

## 2024-08-13 MED ORDER — CARBAMAZEPINE 100 MG/5ML PO SUSP
300.0000 mg | Freq: Three times a day (TID) | ORAL | Status: DC
  Administered 2024-08-13 – 2024-08-21 (×24): 300 mg
  Filled 2024-08-13 (×33): qty 15

## 2024-08-13 MED ORDER — ONDANSETRON HCL 4 MG/2ML IJ SOLN: 4.0000 mg | Freq: Four times a day (QID) | INTRAMUSCULAR | Status: DC | PRN

## 2024-08-13 MED ORDER — FENTANYL CITRATE (PF) 50 MCG/ML IJ SOSY
50.0000 ug | PREFILLED_SYRINGE | INTRAMUSCULAR | Status: DC | PRN
  Administered 2024-08-13: 100 ug via INTRAVENOUS
  Administered 2024-08-13: 50 ug via INTRAVENOUS
  Administered 2024-08-13 – 2024-08-14 (×4): 100 ug via INTRAVENOUS
  Filled 2024-08-13 (×6): qty 2

## 2024-08-13 MED ORDER — NOREPINEPHRINE 4 MG/250ML-% IV SOLN
0.0000 ug/min | INTRAVENOUS | Status: DC
  Administered 2024-08-13: 5 ug/min via INTRAVENOUS
  Administered 2024-08-14: 2 ug/min via INTRAVENOUS
  Administered 2024-08-15 – 2024-08-16 (×2): 3 ug/min via INTRAVENOUS
  Administered 2024-08-16 – 2024-08-21 (×3): 2 ug/min via INTRAVENOUS
  Administered 2024-08-23: 4 ug/min via INTRAVENOUS
  Administered 2024-08-23: 3 ug/min via INTRAVENOUS
  Filled 2024-08-13 (×6): qty 250

## 2024-08-13 MED ORDER — ACETAMINOPHEN 500 MG PO TABS
500.0000 mg | ORAL_TABLET | Freq: Four times a day (QID) | ORAL | Status: DC | PRN
  Administered 2024-08-13 – 2024-08-18 (×4): 500 mg
  Filled 2024-08-13 (×6): qty 1

## 2024-08-13 MED ORDER — FENTANYL CITRATE (PF) 100 MCG/2ML IJ SOLN
INTRAMUSCULAR | Status: AC
  Administered 2024-08-13: 100 ug
  Filled 2024-08-13: qty 2

## 2024-08-13 MED ORDER — ALBUTEROL SULFATE (2.5 MG/3ML) 0.083% IN NEBU
2.5000 mg | INHALATION_SOLUTION | RESPIRATORY_TRACT | Status: DC | PRN
  Administered 2024-08-13 – 2024-08-19 (×4): 2.5 mg via RESPIRATORY_TRACT
  Filled 2024-08-13 (×4): qty 3

## 2024-08-13 MED ORDER — PANTOPRAZOLE SODIUM 40 MG IV SOLR
40.0000 mg | Freq: Every day | INTRAVENOUS | Status: DC
  Administered 2024-08-13 – 2024-08-23 (×11): 40 mg via INTRAVENOUS
  Filled 2024-08-13 (×11): qty 10

## 2024-08-13 MED ORDER — SODIUM CHLORIDE 3 % IN NEBU
4.0000 mL | INHALATION_SOLUTION | Freq: Two times a day (BID) | RESPIRATORY_TRACT | Status: AC
  Administered 2024-08-13 – 2024-08-16 (×4): 4 mL via RESPIRATORY_TRACT
  Filled 2024-08-13 (×5): qty 4

## 2024-08-13 MED ORDER — SUCCINYLCHOLINE CHLORIDE 200 MG/10ML IV SOSY
PREFILLED_SYRINGE | INTRAVENOUS | Status: AC
  Filled 2024-08-13: qty 10

## 2024-08-13 MED ORDER — LACTATED RINGERS IV BOLUS
500.0000 mL | Freq: Once | INTRAVENOUS | Status: AC
  Administered 2024-08-13: 500 mL via INTRAVENOUS

## 2024-08-13 MED ORDER — MIDAZOLAM HCL (PF) 2 MG/2ML IJ SOLN
1.0000 mg | INTRAMUSCULAR | Status: DC | PRN
  Administered 2024-08-13 – 2024-08-23 (×37): 2 mg via INTRAVENOUS
  Filled 2024-08-13 (×39): qty 2

## 2024-08-13 MED ORDER — NOREPINEPHRINE 4 MG/250ML-% IV SOLN
INTRAVENOUS | Status: AC
  Filled 2024-08-13: qty 250

## 2024-08-13 MED ORDER — PHENYLEPHRINE 80 MCG/ML (10ML) SYRINGE FOR IV PUSH (FOR BLOOD PRESSURE SUPPORT)
PREFILLED_SYRINGE | INTRAVENOUS | Status: AC
  Administered 2024-08-13: 160 ug
  Filled 2024-08-13: qty 10

## 2024-08-13 MED ORDER — ETOMIDATE 2 MG/ML IV SOLN
INTRAVENOUS | Status: AC
  Administered 2024-08-13: 20 mg via INTRAVENOUS
  Filled 2024-08-13: qty 20

## 2024-08-13 MED ORDER — ONDANSETRON HCL 4 MG PO TABS: 4.0000 mg | ORAL_TABLET | Freq: Four times a day (QID) | ORAL | Status: DC | PRN

## 2024-08-13 MED ORDER — FENTANYL CITRATE (PF) 50 MCG/ML IJ SOSY
PREFILLED_SYRINGE | INTRAMUSCULAR | Status: AC
  Filled 2024-08-13: qty 2

## 2024-08-13 MED ORDER — OSELTAMIVIR PHOSPHATE 75 MG PO CAPS
75.0000 mg | ORAL_CAPSULE | Freq: Two times a day (BID) | ORAL | Status: DC
  Administered 2024-08-13 – 2024-08-14 (×2): 75 mg
  Filled 2024-08-13 (×3): qty 1

## 2024-08-13 MED ORDER — DEXMEDETOMIDINE HCL IN NACL 200 MCG/50ML IV SOLN
0.0000 ug/kg/h | INTRAVENOUS | Status: DC
  Administered 2024-08-13: 0.4 ug/kg/h via INTRAVENOUS
  Administered 2024-08-13: 0.9 ug/kg/h via INTRAVENOUS
  Administered 2024-08-14 (×2): 1.2 ug/kg/h via INTRAVENOUS
  Administered 2024-08-14: 0.7 ug/kg/h via INTRAVENOUS
  Administered 2024-08-14: 1 ug/kg/h via INTRAVENOUS
  Administered 2024-08-14: 0.9 ug/kg/h via INTRAVENOUS
  Administered 2024-08-14: 1.2 ug/kg/h via INTRAVENOUS
  Administered 2024-08-14: 1.198 ug/kg/h via INTRAVENOUS
  Administered 2024-08-14: 1.2 ug/kg/h via INTRAVENOUS
  Administered 2024-08-15: 1.1 ug/kg/h via INTRAVENOUS
  Administered 2024-08-15: 1 ug/kg/h via INTRAVENOUS
  Administered 2024-08-15 (×2): 1.2 ug/kg/h via INTRAVENOUS
  Administered 2024-08-15: 0.9 ug/kg/h via INTRAVENOUS
  Administered 2024-08-15: 1 ug/kg/h via INTRAVENOUS
  Administered 2024-08-15 – 2024-08-16 (×3): 1.2 ug/kg/h via INTRAVENOUS
  Administered 2024-08-16: 1 ug/kg/h via INTRAVENOUS
  Administered 2024-08-16 (×5): 1.2 ug/kg/h via INTRAVENOUS
  Administered 2024-08-16: 1 ug/kg/h via INTRAVENOUS
  Administered 2024-08-17 (×2): 1.1 ug/kg/h via INTRAVENOUS
  Administered 2024-08-17: 0.8 ug/kg/h via INTRAVENOUS
  Administered 2024-08-17: 1 ug/kg/h via INTRAVENOUS
  Administered 2024-08-17: 1.2 ug/kg/h via INTRAVENOUS
  Administered 2024-08-17: 1.1 ug/kg/h via INTRAVENOUS
  Administered 2024-08-17 – 2024-08-18 (×3): 0.8 ug/kg/h via INTRAVENOUS
  Administered 2024-08-18: 0.7 ug/kg/h via INTRAVENOUS
  Administered 2024-08-18: 0.6 ug/kg/h via INTRAVENOUS
  Administered 2024-08-18: 0.7 ug/kg/h via INTRAVENOUS
  Administered 2024-08-19 (×2): 1.2 ug/kg/h via INTRAVENOUS
  Administered 2024-08-19: 1 ug/kg/h via INTRAVENOUS
  Administered 2024-08-19: 1.2 ug/kg/h via INTRAVENOUS
  Administered 2024-08-19: 0.7 ug/kg/h via INTRAVENOUS
  Administered 2024-08-19: 0.6 ug/kg/h via INTRAVENOUS
  Administered 2024-08-19: 1.2 ug/kg/h via INTRAVENOUS
  Administered 2024-08-20: 0.7 ug/kg/h via INTRAVENOUS
  Administered 2024-08-20 (×4): 1.2 ug/kg/h via INTRAVENOUS
  Filled 2024-08-13 (×29): qty 50
  Filled 2024-08-13: qty 100
  Filled 2024-08-13 (×13): qty 50
  Filled 2024-08-13: qty 100
  Filled 2024-08-13 (×4): qty 50

## 2024-08-13 MED ORDER — DOCUSATE SODIUM 50 MG/5ML PO LIQD
100.0000 mg | Freq: Two times a day (BID) | ORAL | Status: DC
  Administered 2024-08-14 – 2024-08-21 (×16): 100 mg
  Filled 2024-08-13 (×17): qty 10

## 2024-08-13 MED ORDER — FENTANYL CITRATE (PF) 50 MCG/ML IJ SOSY: 50.0000 ug | PREFILLED_SYRINGE | INTRAMUSCULAR | Status: DC | PRN

## 2024-08-13 MED ORDER — ACETAMINOPHEN 650 MG RE SUPP: 650.0000 mg | Freq: Four times a day (QID) | RECTAL | Status: DC | PRN

## 2024-08-13 MED ORDER — SODIUM CHLORIDE 0.9 % IV SOLN
250.0000 mL | INTRAVENOUS | Status: AC
  Administered 2024-08-13: 250 mL via INTRAVENOUS

## 2024-08-13 MED ORDER — VALPROIC ACID 250 MG/5ML PO SOLN
750.0000 mg | Freq: Three times a day (TID) | ORAL | Status: DC
  Administered 2024-08-13 – 2024-08-21 (×23): 750 mg
  Filled 2024-08-13 (×26): qty 15

## 2024-08-13 MED ORDER — FREE WATER
200.0000 mL | Status: DC
  Administered 2024-08-13 – 2024-08-14 (×5): 200 mL

## 2024-08-13 MED ORDER — MIDAZOLAM HCL 2 MG/2ML IJ SOLN
INTRAMUSCULAR | Status: AC
  Administered 2024-08-13: 2 mg
  Filled 2024-08-13: qty 2

## 2024-08-13 MED ORDER — IOHEXOL 300 MG/ML  SOLN
75.0000 mL | Freq: Once | INTRAMUSCULAR | Status: AC | PRN
  Administered 2024-08-13: 75 mL via INTRAVENOUS

## 2024-08-13 MED ORDER — POLYETHYLENE GLYCOL 3350 17 G PO PACK
17.0000 g | PACK | Freq: Every day | ORAL | Status: DC
  Administered 2024-08-14 – 2024-08-21 (×8): 17 g
  Filled 2024-08-13 (×9): qty 1

## 2024-08-13 MED ORDER — LACTATED RINGERS IV SOLN: INTRAVENOUS | Status: DC

## 2024-08-13 MED ORDER — ENOXAPARIN SODIUM 30 MG/0.3ML IJ SOSY
30.0000 mg | PREFILLED_SYRINGE | Freq: Every day | INTRAMUSCULAR | Status: DC
  Administered 2024-08-13 – 2024-08-21 (×9): 30 mg via SUBCUTANEOUS
  Filled 2024-08-13 (×9): qty 0.3

## 2024-08-13 MED ORDER — ROCURONIUM BROMIDE 10 MG/ML (PF) SYRINGE
PREFILLED_SYRINGE | INTRAVENOUS | Status: AC
  Administered 2024-08-13: 50 mg
  Filled 2024-08-13: qty 10

## 2024-08-13 MED ORDER — LEVOFLOXACIN IN D5W 750 MG/150ML IV SOLN
750.0000 mg | INTRAVENOUS | Status: DC
  Administered 2024-08-14 – 2024-08-19 (×6): 750 mg via INTRAVENOUS
  Filled 2024-08-13 (×6): qty 150

## 2024-08-13 MED ORDER — LEVOFLOXACIN IN D5W 750 MG/150ML IV SOLN
750.0000 mg | Freq: Once | INTRAVENOUS | Status: AC
  Administered 2024-08-13: 750 mg via INTRAVENOUS
  Filled 2024-08-13: qty 150

## 2024-08-13 NOTE — Consult Note (Signed)
 "  NAME:  Wayne Meyers, MRN:  [id number], DOB:  04/27/1991, LOS: 0 ADMISSION DATE:  08/13/2024, CONSULTATION DATE:  08/13/24 REFERRING MD:  Arthea , CHIEF COMPLAINT:  SOB   History of Present Illness:  34 yo M PMH CP, spastic quadriparesis, cortical blindness, anoxic brain injury, sz disorder, dysphagia G tube in situ who presented to [facility] ED 08/13/24 at referral of PCP for eval of SOB + hypoxia + Flu A positive.  CXR in ED w elevated R hemidiaphragm. CT chest with debris in airway, R mucus plugging. On levaquin , tamiflu    Admitted to [facility]  PCCM consulted in this setting   Pertinent  Medical History  CP Spastic quadriplegia Anoxic brain injury  Cortical blindness  Sz disorder G tube in situ   Significant Hospital Events: Including procedures, antibiotic start and stop dates in addition to other pertinent events   08/13/24 admit to [facility]. Levaquin , tamiflu . PCCM consult   Interim History / Subjective:  Tachycardic and incr RR   Father at bedside   Objective    Blood pressure 136/84, pulse (!) 137, temperature 99.4 F (37.4 C), temperature source Oral, resp. rate (!) 27, weight 62.1 kg, SpO2 94%.       No intake or output data in the 24 hours ending 08/13/24 1649 Filed Weights   08/13/24 1044  Weight: 62.1 kg    Examination: General: chronically and acutely ill M in distress  HENT: Anicteric sclera pink mm poor dentition Lungs: Rhonchi. Incr RR, intermittent accessory muscle use  Cardiovascular: tachycardic  Abdomen: G tube  Extremities: no acute appearing joint deformity, chronic contractures  Neuro: Does not follow commands  GU: defer  Resolved problem list   Assessment and Plan    Acute resp failure w hypoxia Sepsis 2/2 Flu A Mucus plug  Elevated R hemidiaphragm  -biggest problem acutely is cough mechanics vs secretion burden.  P  -O2 for goal > 92 -trying salvage NTS + hypertonic neb to see if we can clear secretions  -suspect will need intubation +  bronch -if bronch, send BAL  - levaquin  was ordered for PNA  -tamiflu    CP Cognitive delay  Spastic quadriparesis  Cortical blindness G tube in situ  Hx anoxic brain injury Hx seizure disorder  P -I have added his home AEDs and placed RDN consult for inpt EN    Plan discussed w father who is pts legal guardian   Labs   CBC: Recent Labs  Lab 08/13/24 1215 08/13/24 1228  WBC 13.7*  --   NEUTROABS 9.4*  --   HGB 13.3 13.6  HCT 39.8 40.0  MCV 89.2  --   PLT 136*  --     Basic Metabolic Panel: Recent Labs  Lab 08/13/24 1228  NA 130*  K 3.8  CL 92*  GLUCOSE 96  BUN 5*  CREATININE 0.30*   GFR: CrCl cannot be calculated (Unknown ideal weight.). Recent Labs  Lab 08/13/24 1215 08/13/24 1227  WBC 13.7*  --   LATICACIDVEN  --  1.0    Liver Function Tests: No results for input(s): AST, ALT, ALKPHOS, BILITOT, PROT, ALBUMIN in the last 168 hours. No results for input(s): LIPASE, AMYLASE in the last 168 hours. No results for input(s): AMMONIA in the last 168 hours.  ABG    Component Value Date/Time   HCO3 33.7 (H) 08/13/2024 1215   TCO2 28 08/13/2024 1228   O2SAT 84.8 08/13/2024 1215     Coagulation Profile: No results for input(s):  INR, PROTIME in the last 168 hours.  Cardiac Enzymes: No results for input(s): CKTOTAL, CKMB, CKMBINDEX, TROPONINI in the last 168 hours.  HbA1C: No results found for: HGBA1C  CBG: No results for input(s): GLUCAP in the last 168 hours.  Review of Systems:   Unable to obtain 2/2 baseline cognitive delay   Past Medical History:  He,  has a past medical history of Cerebral palsy (HCC), Esophageal reflux, Mental retardation, Seizure (HCC), Seizures (HCC), and Vision abnormalities.   Surgical History:   Past Surgical History:  Procedure Laterality Date   BUTTON CHANGE N/A 04/05/2014   Procedure: BUTTON CHANGE;  Surgeon: Lynwood LITTIE Celestia Mickey., MD;  Location: [facility];  Service:  Endoscopy;  Laterality: N/A;   BUTTON CHANGE N/A 01/09/2016   Procedure: BUTTON CHANGE;  Surgeon: Norleen Hint, MD;  Location: [facility];  Service: Endoscopy;  Laterality: N/A;  may need sav dilators   BUTTON CHANGE N/A 04/14/2017   Procedure: BUTTON CHANGE;  Surgeon: Saintclair Jasper, MD;  Location: [facility];  Service: Gastroenterology;  Laterality: N/A;   BUTTON CHANGE N/A 10/04/2018   Procedure: BUTTON CHANGE;  Surgeon: Saintclair Jasper, MD;  Location: [facility];  Service: Gastroenterology;  Laterality: N/A;   BUTTON CHANGE N/A 06/30/2021   Procedure: BUTTON CHANGE;  Surgeon: Saintclair Jasper, MD;  Location: [facility];  Service: Gastroenterology;  Laterality: N/A;   CIRCUMCISION  1990   HERNIA REPAIR N/A    Phreesia 11/26/2020   NISSEN FUNDOPLICATION  1986   PEG PLACEMENT  12/07/2011   Procedure: PERCUTANEOUS ENDOSCOPIC GASTROSTOMY (PEG) REPLACEMENT;  Surgeon: Lesta JULIANNA Fitz, MD;  Location: [facility];  Service: Endoscopy;  Laterality: N/A;  20 french 4.4 cent.   PEG PLACEMENT N/A 01/04/2013   Procedure: PERCUTANEOUS ENDOSCOPIC GASTROSTOMY (PEG) REPLACEMENT;  Surgeon: Norleen JAYSON Hint, MD;  Location: [facility];  Service: Endoscopy;  Laterality: N/A;   PEG PLACEMENT N/A 03/19/2015   Procedure: PERCUTANEOUS ENDOSCOPIC GASTROSTOMY (PEG) REPLACEMENT;  Surgeon: Jerrell Sol, MD;  Location: [facility] ENDOSCOPY;  Service: Endoscopy;  Laterality: N/A;   PEG PLACEMENT N/A 09/21/2016   Procedure: PERCUTANEOUS ENDOSCOPIC GASTROSTOMY (PEG) REPLACEMENT;  Surgeon: Lynwood Celestia, MD;  Location: [facility];  Service: Endoscopy;  Laterality: N/A;   PEG PLACEMENT N/A 02/13/2020   Procedure: PERCUTANEOUS ENDOSCOPIC GASTROSTOMY (PEG) REPLACEMENT;  Surgeon: Sol Jerrell, MD;  Location: [facility];  Service: Endoscopy;  Laterality: N/A;   PEG PLACEMENT N/A 10/12/2022   Procedure: PERCUTANEOUS ENDOSCOPIC GASTROSTOMY (PEG) REPLACEMENT;  Surgeon: Saintclair Jasper, MD;  Location: [facility];  Service: Gastroenterology;   Laterality: N/A;   PEG PLACEMENT  04/11/2024   Procedure: REPLACEMENT, PEG TUBE, WITHOUT ENDOSCOPY;  Surgeon: Sol Jerrell, MD;  Location: [facility];  Service: Gastroenterology;;     Social History:   reports that he has never smoked. He has never used smokeless tobacco. He reports that he does not drink alcohol and does not use drugs.   Family History:  His family history includes Migraines in his mother; Other in his paternal grandfather; Seizures in his brother and mother.   Allergies Allergies[1]   Home Medications  Prior to Admission medications  Medication Sig Start Date End Date Taking? Authorizing Provider  albuterol  (PROVENTIL ) (2.5 MG/3ML) 0.083% nebulizer solution Take 2.5 mg by nebulization every 6 (six) hours as needed for wheezing or shortness of breath.   Yes [provider]  carBAMazepine  (TEGRETOL ) 100 MG/5ML suspension TAKE 15 ML BY MOUTH 3 TIMES DAILY Patient taking differently: Place 300 mg into feeding tube in the morning,  at noon, and at bedtime. 11/29/23  Yes Marianna City, NP  cetirizine (ZYRTEC) 10 MG tablet Place 10 mg into feeding tube See admin instructions. CRUSH 10 mg and administer 10 mg, PER TUBE, at bedtime   Yes [provider]  chlorpheniramine (CHLOR-TRIMETON) 4 MG tablet Place 2 mg into feeding tube See admin instructions. CRUSH 2 mg and administer, PER TUBE, three times a day   Yes [provider]  diphenhydrAMINE  HCl 50 MG/30ML LIQD Place 50 mg into feeding tube at bedtime.   Yes [provider]  glycopyrrolate  (ROBINUL ) 1 MG tablet Take 1 tablet (1 mg total) by mouth 3 (three) times daily. Patient taking differently: Place 1 mg into feeding tube in the morning, at noon, and at bedtime. 11/29/23  Yes Marianna City, NP  ibuprofen  (ADVIL ) 100 MG/5ML suspension Take 400 mg by mouth every 6 (six) hours as needed for mild pain (pain score 1-3) or fever.   Yes [provider]  Melatonin 1 MG SUBL Place  3-5 mg into feeding tube at bedtime.   Yes [provider]  NON FORMULARY Place 4 oz into feeding tube See admin instructions. Activia yogurt, diluted - 4 ounces, per tube, at 6 PM   Yes [provider]  Nutritional Supplements (FEEDING SUPPLEMENT, JEVITY 1.5 CAL,) LIQD Place 237 mLs into feeding tube 4 (four) times daily.    [provider]  valproic  acid (DEPAKENE ) 250 MG/5ML solution GIVE 15 MLS BY G-TUBE 3 TIMES PER DAY Patient taking differently: Place 750 mg into feeding tube See admin instructions. 11/29/23   Marianna City, NP     Critical care time: 37 min     CRITICAL CARE Performed by: Ronnald FORBES Gave   Total critical care time: 37 minutes  Critical care time was exclusive of separately billable procedures and treating other patients. Critical care was necessary to treat or prevent imminent or life-threatening deterioration.  Critical care was time spent personally by me on the following activities: development of treatment plan with patient and/or surrogate as well as nursing, discussions with consultants, evaluation of patient's response to treatment, examination of patient, obtaining history from patient or surrogate, ordering and performing treatments and interventions, ordering and review of laboratory studies, ordering and review of radiographic studies, pulse oximetry and re-evaluation of patient's condition.  Ronnald Gave MSN, AGACNP-BC [facility] Amion for pager  08/13/2024, 4:49 PM      [1]  Allergies Allergen Reactions   Augmentin [Amoxicillin-Pot Clavulanate] Diarrhea   Amoxicillin Other (See Comments)    Childhood allergy- no reaction noted   Latex Rash and Other (See Comments)    No powdered gloves!!!!   Powders [Talc] Rash and Other (See Comments)    From powdered gloves   "

## 2024-08-13 NOTE — ED Provider Notes (Signed)
 " [place] EMERGENCY DEPARTMENT AT [facility] Main Provider Note   CSN: [id number] Arrival date & time: 08/13/24  1031     Patient presents with: Influenza and Shortness of Breath   Wayne Meyers is a 34 y.o. male who was referred to the ED by primary care after a influenza diagnosis however patient does not have need for oxygen at baseline, was noted to the labored breathing at home, on assessment at primary care is noted to be hypoxic without supplemental oxygen.  He has a history of cerebral palsy, epilepsy, extensive surgical history.  Review of primary care notes shows pulse oximetry of 89% on room air, with improvement in 97% and supplemental oxygen by nasal cannula.  Brought in by EMS for likely admission for new oxygen requirement, worsening pulmonary status, possible pneumonia.  It is also noted that their testing today that he is positive for influenza A.  Outpatient notes that his show that he had rhonchi on their exam therefore was concern for new onset aspiration pneumonia.    Influenza Presenting symptoms: cough and shortness of breath   Shortness of Breath Associated symptoms: cough        Prior to Admission medications  Medication Sig Start Date End Date Taking? Authorizing Provider  albuterol  (VENTOLIN  HFA) 108 (90 Base) MCG/ACT inhaler Inhale into the lungs every 6 (six) hours as needed for wheezing or shortness of breath. Patient not taking: Reported on 11/29/2023    [provider]  carBAMazepine  (TEGRETOL ) 100 MG/5ML suspension TAKE 15 ML BY MOUTH 3 TIMES DAILY 11/29/23   Marianna City, NP  cetirizine (ZYRTEC) 10 MG tablet Take 10 mg by mouth daily.    [provider]  chlorpheniramine (CHLOR-TRIMETON) 4 MG tablet Place 2 mg into feeding tube 3 (three) times daily.    [provider]  diphenhydrAMINE  HCl 50 MG/30ML LIQD Take 33 mg by mouth at bedtime.    [provider]  glycopyrrolate  (ROBINUL ) 1 MG tablet Take 1  tablet (1 mg total) by mouth 3 (three) times daily. 11/29/23   Marianna City, NP  Melatonin 10 MG CAPS Take by mouth. 1/2 at night    [provider]  Nutritional Supplements (FEEDING SUPPLEMENT, JEVITY 1.5 CAL,) LIQD Place 237 mLs into feeding tube 4 (four) times daily.    [provider]  valproic  acid (DEPAKENE ) 250 MG/5ML solution GIVE 15 MLS BY G-TUBE 3 TIMES PER DAY 11/29/23   Marianna City, NP    Allergies: Augmentin [amoxicillin-pot clavulanate], Amoxicillin, Latex, and Powders [talc]    Review of Systems  Respiratory:  Positive for cough and shortness of breath.   All other systems reviewed and are negative.   Updated Vital Signs BP 136/84 (BP Location: Right Arm)   Pulse (!) 137   Temp 99.4 F (37.4 C) (Oral)   Resp (!) 27   Wt 62.1 kg   SpO2 94%   Physical Exam Vitals and nursing note reviewed.  Constitutional:      General: He is awake. He is not in acute distress.    Appearance: Normal appearance.     Interventions: Nasal cannula in place.  HENT:     Head: Normocephalic and atraumatic.     Mouth/Throat:     Mouth: Mucous membranes are moist.     Pharynx: Oropharynx is clear. Uvula midline.  Eyes:     Extraocular Movements: Extraocular movements intact.     Conjunctiva/sclera: Conjunctivae normal.     Pupils: Pupils are equal, round,  and reactive to light.  Cardiovascular:     Rate and Rhythm: Normal rate and regular rhythm.     Pulses: Normal pulses.     Heart sounds: Normal heart sounds. No murmur heard.    No friction rub. No gallop.  Pulmonary:     Effort: Pulmonary effort is normal.     Breath sounds: Examination of the right-lower field reveals rhonchi. Examination of the left-lower field reveals rhonchi. Rhonchi present.  Abdominal:     General: Abdomen is flat. Bowel sounds are normal.     Palpations: Abdomen is soft.  Musculoskeletal:        General: Normal range of motion.     Cervical back: Normal range of motion and  neck supple.     Right lower leg: No edema.     Left lower leg: No edema.  Skin:    General: Skin is warm and dry.     Capillary Refill: Capillary refill takes less than 2 seconds.  Neurological:     General: No focal deficit present.     Mental Status: He is alert. Mental status is at baseline.     Comments: Patient is alert and oriented to baseline, baseline nonverbal, contractures of the upper and lower extremities.  Psychiatric:        Mood and Affect: Mood normal.        Behavior: Behavior is cooperative.     (all labs ordered are listed, but only abnormal results are displayed) Labs Reviewed  RESP PANEL BY RT-PCR (RSV, FLU A&B, COVID)  RVPGX2 - Abnormal; Notable for the following components:      Result Value   Influenza A by PCR POSITIVE (*)    All other components within normal limits  CBC WITH DIFFERENTIAL/PLATELET - Abnormal; Notable for the following components:   WBC 13.7 (*)    Platelets 136 (*)    Neutro Abs 9.4 (*)    Monocytes Absolute 3.1 (*)    All other components within normal limits  BLOOD GAS, VENOUS - Abnormal; Notable for the following components:   pO2, Ven 49 (*)    Bicarbonate 33.7 (*)    Acid-Base Excess 7.6 (*)    All other components within normal limits  I-STAT CHEM 8, ED - Abnormal; Notable for the following components:   Sodium 130 (*)    Chloride 92 (*)    BUN 5 (*)    Creatinine, Ser 0.30 (*)    All other components within normal limits  CULTURE, BLOOD (ROUTINE X 2)  CULTURE, BLOOD (ROUTINE X 2)  COMPREHENSIVE METABOLIC PANEL WITH GFR  PATHOLOGIST SMEAR REVIEW  I-STAT CG4 LACTIC ACID, ED  I-STAT CG4 LACTIC ACID, ED    EKG: None  Radiology: CT Chest W Contrast Result Date: 08/13/2024 CLINICAL DATA:  Interstitial lung disease.  Flu positive.  Fever. EXAM: CT CHEST WITH CONTRAST TECHNIQUE: Multidetector CT imaging of the chest was performed during intravenous contrast administration. RADIATION DOSE REDUCTION: This exam was performed  according to the departmental dose-optimization program which includes automated exposure control, adjustment of the mA and/or kV according to patient size and/or use of iterative reconstruction technique. CONTRAST:  75mL OMNIPAQUE  IOHEXOL  300 MG/ML  SOLN COMPARISON:  None Available. FINDINGS: Cardiovascular: Heart is enlarged.  No pericardial effusion. Mediastinum/Nodes: Thoracic inlet lymph nodes are not enlarged by CT size criteria. No pathologically enlarged mediastinal, hilar or axillary lymph nodes. Esophagus is grossly unremarkable. Lungs/Pleura: Image quality is degraded by expiratory phase imaging and respiratory motion.  Collapse/consolidation in the right middle and right lower lobes with significant debris in the trachea and right mainstem bronchus. Probable focal subpleural atelectasis in the posterior left upper lobe (3/20). Upper Abdomen: Elevated right hemidiaphragm. 1.7 x 1.7 cm heterogeneous nodule in the upper pole left kidney (2/85), cannot be characterized as a simple cyst. Hiatal hernia repair. Visualized portions of the liver, gallbladder, adrenal glands, kidneys, spleen, pancreas, stomach and bowel are otherwise grossly unremarkable. No upper abdominal adenopathy. Musculoskeletal: Degenerative changes in the spine. S shaped scoliosis. IMPRESSION: 1. Probable mucous plugging in the right mainstem bronchus with complete collapse of the right lower lobe and partial atelectasis of the right middle lobe. Recommend follow-up to clearing to exclude underlying malignancy. 2. 1.7 cm heterogeneous nodule in the upper pole left kidney. Renal cell carcinoma cannot be excluded. Further evaluation with pre and post contrast MRI or CT should be considered. MRI is preferred in younger patients (due to lack of ionizing radiation) and for evaluating calcified lesion(s). Electronically Signed   By: Newell Eke M.D.   On: 08/13/2024 14:25   DG Chest Port 1 View Result Date: 08/13/2024 EXAM: 1 VIEW(S) XRAY  OF THE CHEST 08/13/2024 11:21:00 AM COMPARISON: Comparison 01/01/2005. CLINICAL HISTORY: Shortness of breath. FINDINGS: LUNGS AND PLEURA: Mild pulmonary edema. Elevated right diaphragm with airspace disease at right base. Low lung volumes. No pleural effusion. No pneumothorax. HEART AND MEDIASTINUM: No acute abnormality of the cardiac and mediastinal silhouettes. BONES AND SOFT TISSUES: Interval growth. Scoliosis of spine. Air-filled bowel in upper abdomen. IMPRESSION: 1. Mild pulmonary edema. 2. Elevated right hemidiaphragm with right basilar airspace disease. 3. Low lung volumes. Electronically signed by: Dayne Hassell MD 08/13/2024 12:56 PM EST RP Workstation: [id number]     Procedures   Medications Ordered in the ED  levofloxacin  (LEVAQUIN ) IVPB 750 mg (has no administration in time range)  iohexol  (OMNIPAQUE ) 300 MG/ML solution 75 mL (75 mLs Intravenous Contrast Given 08/13/24 1345)                                    Medical Decision Making Amount and/or Complexity of Data Reviewed Labs: ordered. Radiology: ordered.  Risk Prescription drug management. Decision regarding hospitalization.   Medical Decision Making:   Wayne Meyers is a 34 y.o. male who presented to the ED today with worsening dyspnea detailed above.    Additional history discussed with patient's family/caregivers.  Complete initial physical exam performed, notably the patient  was alert and oriented to baseline which is decreased secondary to his history of CP, in no apparent distress.   Rhonchi noted primarily through the right lung but also noted in the left side as well.  Noted hypoxia on physical exam, requiring oxygen at 3 L/min by nasal cannula to return to above 90%. Reviewed and confirmed nursing documentation for past medical history, family history, social history.    Initial Assessment:   With the patient's presentation of worsening dyspnea, he has had a possible pneumonia, pleural effusion, pneumothorax,  hemothorax. This is most consistent with an acute complicated illness  Initial Plan:  Obtain respiratory panel nasopharyngeal swab to evaluate for viral etiology Obtain VBG to assess oxygenation, acid-base status Screening labs including CBC and Metabolic panel to evaluate for infectious or metabolic etiology of disease.  CXR to evaluate for structural/infectious intrathoracic pathology.  EKG to evaluate for cardiac pathology Objective evaluation as below reviewed   Initial Study Results:  Laboratory  All laboratory results reviewed without evidence of clinically relevant pathology.   Exceptions include: Macrocephaly to 33.7 with p.o. to 49, leukocytosis of 13.7  EKG EKG was reviewed independently. Rate, rhythm, axis, intervals all examined and without medically relevant abnormality. ST segments without concerns for elevations.    Radiology:  All images reviewed independently. Agree with radiology report at this time.   CT Chest W Contrast Result Date: 08/13/2024 CLINICAL DATA:  Interstitial lung disease.  Flu positive.  Fever. EXAM: CT CHEST WITH CONTRAST TECHNIQUE: Multidetector CT imaging of the chest was performed during intravenous contrast administration. RADIATION DOSE REDUCTION: This exam was performed according to the departmental dose-optimization program which includes automated exposure control, adjustment of the mA and/or kV according to patient size and/or use of iterative reconstruction technique. CONTRAST:  75mL OMNIPAQUE  IOHEXOL  300 MG/ML  SOLN COMPARISON:  None Available. FINDINGS: Cardiovascular: Heart is enlarged.  No pericardial effusion. Mediastinum/Nodes: Thoracic inlet lymph nodes are not enlarged by CT size criteria. No pathologically enlarged mediastinal, hilar or axillary lymph nodes. Esophagus is grossly unremarkable. Lungs/Pleura: Image quality is degraded by expiratory phase imaging and respiratory motion. Collapse/consolidation in the right middle and right lower  lobes with significant debris in the trachea and right mainstem bronchus. Probable focal subpleural atelectasis in the posterior left upper lobe (3/20). Upper Abdomen: Elevated right hemidiaphragm. 1.7 x 1.7 cm heterogeneous nodule in the upper pole left kidney (2/85), cannot be characterized as a simple cyst. Hiatal hernia repair. Visualized portions of the liver, gallbladder, adrenal glands, kidneys, spleen, pancreas, stomach and bowel are otherwise grossly unremarkable. No upper abdominal adenopathy. Musculoskeletal: Degenerative changes in the spine. S shaped scoliosis. IMPRESSION: 1. Probable mucous plugging in the right mainstem bronchus with complete collapse of the right lower lobe and partial atelectasis of the right middle lobe. Recommend follow-up to clearing to exclude underlying malignancy. 2. 1.7 cm heterogeneous nodule in the upper pole left kidney. Renal cell carcinoma cannot be excluded. Further evaluation with pre and post contrast MRI or CT should be considered. MRI is preferred in younger patients (due to lack of ionizing radiation) and for evaluating calcified lesion(s). Electronically Signed   By: Newell Eke M.D.   On: 08/13/2024 14:25   DG Chest Port 1 View Result Date: 08/13/2024 EXAM: 1 VIEW(S) XRAY OF THE CHEST 08/13/2024 11:21:00 AM COMPARISON: Comparison 01/01/2005. CLINICAL HISTORY: Shortness of breath. FINDINGS: LUNGS AND PLEURA: Mild pulmonary edema. Elevated right diaphragm with airspace disease at right base. Low lung volumes. No pleural effusion. No pneumothorax. HEART AND MEDIASTINUM: No acute abnormality of the cardiac and mediastinal silhouettes. BONES AND SOFT TISSUES: Interval growth. Scoliosis of spine. Air-filled bowel in upper abdomen. IMPRESSION: 1. Mild pulmonary edema. 2. Elevated right hemidiaphragm with right basilar airspace disease. 3. Low lung volumes. Electronically signed by: Dayne Hassell MD 08/13/2024 12:56 PM EST RP Workstation: [id number]     Reassessment and Plan:   X-ray imaging did show elevated hemidiaphragm with mild pulmonary edema, due to the quality image CT imaging was obtained which demonstrates mucous plugging in the right mainstem bronchus with complete collapse of the right lower lobe and partial and partial atelectasis of the right middle lobe.  Given this new finding, have initiated sepsis orders on this patient as they are tachypneic, tachycardic, and have a leukocytosis of 13.7., continued oxygen therapy as patient is hypoxic without, and will admit.  Case is discussed with Dr. Arthea with the hospitalist service who accepts patient for admission for hypoxic respiratory failure, and  continue workup for IV antibiotics and pulmonology evaluation.  Need for admission was discussed with the family who understands agrees has no further concerns at this time       Final diagnoses:  Acute respiratory failure with hypoxia [facility])    ED Discharge Orders     None          Wayne Meyers, [place] 08/13/24 1524    Tegeler, Lonni PARAS, MD 08/14/24 1129  "

## 2024-08-13 NOTE — Procedures (Signed)
 Bronchoscopy Procedure Note  ALDER MURRI  [id number]  1991-01-25  Date:08/13/2024  Time:5:49 PM   Provider Performing:Caly Pellum V. Chantee Cerino   Procedure(s):  Flexible bronchoscopy with bronchial alveolar lavage (68375)  Indication(s) Atelectasis / respiratory failure  Consent Risks of the procedure as well as the alternatives and risks of each were explained to the patient and/or caregiver.  Consent for the procedure was obtained and is signed in the bedside chart  Anesthesia Etomidate , fent, versed  for intubation   Time Out Verified patient identification, verified procedure, site/side was marked, verified correct patient position, special equipment/implants available, medications/allergies/relevant history reviewed, required imaging and test results available.   Sterile Technique Usual hand hygiene, masks, gowns, and gloves were used   Procedure Description Bronchoscope advanced through endotracheal tube and into airway.  Airways were examined down to subsegmental level with findings noted below.   Following diagnostic evaluation, BAL(s) performed in BLL airways  with normal saline and return of purulent fluid  Findings: ETT @ carina , withdrawn 2 cm. Purulent secretions copious blocking RLL & LLL airways, suctioned out with lavage   Complications/Tolerance None; patient tolerated the procedure well. Chest X-ray is needed post procedure.   EBL Minimal   Specimen(s) BAL cutture  Harden GAILS. Jude MD

## 2024-08-13 NOTE — Procedures (Addendum)
 Bedside Bronchoscopy Procedure Note Wayne Meyers [id number] July 11, 1991  Procedure: Bronchoscopy Indications: Remove secretions  Procedure Details: ET Tube Size: ET Tube secured at lip (cm): Bite block in place: Yes In preparation for procedure, Patient hyper-oxygenated with 100 % FiO2 Airway entered and the following bronchi were examined: LUL.   Patient placed back on 100% FiO2 at conclusion of procedure.    Evaluation BP 136/84 (BP Location: Right Arm)   Pulse (!) 137   Temp 99.4 F (37.4 C) (Oral)   Resp (!) 27   Wt 62.1 kg   SpO2 95%  Breath Sounds:Rhonch O2 sats: stable throughout Patient's Current Condition: stable Specimens:  Sent serosanguinous fluid Complications: No apparent complications Patient did tolerate procedure well.  Pt bronched by Dr. Jude. Pt tolerated well, specimen sent to lab and pt placed on vent after procedure.  Wayne Meyers 08/13/2024, 5:54 PM

## 2024-08-13 NOTE — ED Triage Notes (Signed)
 Pt BIBA from [facility], pt tested + Flu A.; sent from PCP fr evaluation for possible pulmonary edema. Father states pt had Fever last night. 96% on 3L. Father at bedside. Had Ibuprofen  0700 400mg .  Pt has cerebral palsy, pt quadriplegic & blind

## 2024-08-13 NOTE — Progress Notes (Signed)
 [facility] - [facility] [place] [address] [name]  Acute Visit Wayne Meyers is a 34 y.o. male DOB: [birth year]   Subjective  Wayne Meyers is a 34 y.o. male who presents for  Chief Complaint  Patient presents with   Fever    Pt presents to the clinic with caregiver for fever since yesterday. Pt does not have any fever now since  he took anti fever medicine  this morning.   .  Issues addressed by patient and physician today include:   History of Present Illness The patient is a 34 year old male who presents for evaluation of influenza.  He was brought to the clinic today due to concerns about his oxygen levels, which were in the 80s. After being placed on 2 liters of oxygen, his saturation improved to 97 percent. He does not typically require supplemental oxygen at home or during transportation. The decision to seek emergency care was initially postponed due to the complexity of his medical needs, which are primarily managed by a certified nursing assistant (CNA).   He has a feeding tube in place and receives care from gastroenterology specialists in [place] and a neurologist, Dr. Sabrina, at [facility]. He is nonverbal and quadriplegic.    Review of Systems  Constitutional:  Positive for fatigue and fever. Negative for appetite change.  HENT:  Positive for congestion.   Eyes: Negative.   Respiratory:  Positive for shortness of breath and wheezing. Negative for cough.   Cardiovascular: Negative.   Gastrointestinal: Negative.   Endocrine: Negative.   Genitourinary: Negative.   Musculoskeletal: Negative.   Skin: Negative.   Allergic/Immunologic: Negative.   Neurological: Negative.   Hematological: Negative.   Psychiatric/Behavioral: Negative.         Nonverbal      __________________________________________________________________  Problem List[1]  Health Maintenance Status       Date Due Completion Dates    Comprehensive Annual Visit Never done ---   DTaP/Tdap/Td Vaccines (5 - Tdap) 12/25/1999 03/18/1994, 05/15/1990   Varicella Vaccines (1 of 2 - 13+ 2-dose series) Never done ---   HIV Screening Never done ---   Hepatitis C Screening Never done ---   Hepatitis B Vaccines (1 of 3 - 19+ 3-dose series) Never done ---   Influenza Vaccine (1) 03/09/2024 04/15/2022, 06/27/2017   COVID-19 Vaccine (1 - 2025-26 season) Never done ---   Depression Screening 01/24/2025 01/25/2024   Adult RSV (50+ Years or Pregnancy) (1 - 1-dose 75+ series) 12/25/2063 ---        Immunization History  Administered Date(s) Administered   Influenza, Injectable, Quadrivalent, Preservative Free 05/18/2016, 06/27/2017, 04/15/2022    Surgical History[2]  Family History[3]  Tobacco Use: Low Risk (08/13/2024)   Patient History    Smoking Tobacco Use: Never    Smokeless Tobacco Use: Never    Passive Exposure: Not on file    Allergies[4]  Current Medications[5]  Objective    Wt Readings from Last 3 Encounters:  01/25/24 56.7 kg (125 lb)    Vitals:   08/13/24 0908 08/13/24 0929  BP: 122/70   BP Location: Left arm   Patient Position: Sitting   Pulse: (!) 135   Resp: (!) 26   Temp: 98 F (36.7 C)   TempSrc: Oral   SpO2: (!) 89% 97%    Physical Exam Vitals reviewed.  Constitutional:      Appearance: Normal appearance. He is normal weight. He is ill-appearing.  HENT:  Nose: Congestion present.  Cardiovascular:     Rate and Rhythm: Regular rhythm. Tachycardia present.     Heart sounds: Normal heart sounds.  Pulmonary:     Effort: Pulmonary effort is normal. No respiratory distress.     Breath sounds: Wheezing (inspiratory and expiratory wheezes throughout lung fields) and rhonchi present.  Musculoskeletal:     Comments: Wheelchair bound- quadriplegic  Skin:    General: Skin is warm and dry.  Neurological:     General: No focal deficit present.     Mental Status: He is alert and oriented to  person, place, and time.  Psychiatric:        Behavior: Behavior normal.     Comments: At baseline- nonverbal      Labs: Recent Results (from the past week)  POCT COVID BD   Collection Time: 08/13/24  9:12 AM  Result Value Ref Range   COVID Antigen, POC Negative Negative   Internal Control Acceptable    Kit/Device Lot # [id number]    Kit/Device Expiration Date 2122026   POC Rapid Influenza A & B Antigen   Collection Time: 08/13/24  9:13 AM  Result Value Ref Range   Flu A Positive (A) Negative   Flu B Negative Negative   Internal Control Acceptable    Kit/Device Lot # [id number]    Kit/Device Expiration Date 2282027       Assessment/Plan   Assessment & Plan Influenza A Hypoxia - His oxygen saturation was initially in the 80s but improved to 97% after administering 2 liters of oxygen. His heart rate is elevated at 129 bpm. Father with patient is primary caregiver. Given his high-risk status, there is a significant concern for rapid progression to pneumonia and dehydration. - His lung sounds are notably wheezy, which is common with influenza. He is nonverbal and quadriplegic, requiring close monitoring and oxygen support throughout the course of his illness. - An ambulance will be arranged to transport him to the emergency room for admission and further monitoring. - Intravenous antivirals will be administered as part of his treatment regimen in the hospital.   Orders:   POCT COVID BD   POC Rapid Influenza A & B Antigen  Cerebral palsy, quadriplegic    (CMD)      Return if symptoms worsen or fail to improve.          [1] Patient Active Problem List Diagnosis   Acid reflux   Cerebral palsy, quadriplegic    (CMD)   Convulsions/seizures (CMD)   CP (cerebral palsy), congenital    (CMD)   Inability to communicate   Wheelchair dependence   Need for influenza vaccination   Periorbital cellulitis of right eye   On enteral nutrition   S/P percutaneous  endoscopic gastrostomy (PEG) tube placement    (CMD)  [2] Past Surgical History: Procedure Laterality Date   FEEDING TUBE PLACEMENT     Procedure: FEEDING TUBE PLACEMENT  [3] Family History Problem Relation Name Age of Onset   Diabetes Paternal Grandmother     Hypertension Paternal Grandmother    [4] Allergies Allergen Reactions   Amoxicillin-Pot Clavulanate Diarrhea   Amoxicillin Other (See Comments)    unknown   Talc Rash    From powdered gloves  [5]  Current Outpatient Medications:    albuterol  2.5 mg /3 mL (0.083 %) nebulizer solution, Take 2.5 mg by nebulization every 4 (four) hours as needed for wheezing or shortness of breath., Disp: 375 mL, Rfl: 1   glycopyrrolate  (ROBINUL )  1 mg tablet, Take 1 mg by G-Tube twice per day, Disp: , Rfl: 5   lactose-reduced food with fiber (Jevity 1.5 Cal) 0.06 gram-1.5 kcal/mL liqd liquid, 1 can per g-tube feeding, Disp: , Rfl:    TegretoL  100 mg/5 mL oral suspension, Take 15 ml by G-Tube three times per day, Disp: , Rfl: 5   valproate (DEPAKENE ) 250 mg/5 mL solution, by G-tube route 3 (three) times a day., Disp: , Rfl:

## 2024-08-13 NOTE — Progress Notes (Addendum)
 eLink Physician-Brief Progress Note Patient Name: Wayne Meyers DOB: [birth year] MRN: [id number]   Date of Service  08/13/2024  HPI/Events of Note  34 year old with cerebral palsy, chronic PEG, seizure disorder admitted with flulike symptoms and hypoxia. BP 81/37, Map 50  eICU Interventions  Attempt LR bolus Add FWF per gastrostomy If he fails to respond, will initiate NE   2307 -initiate norepinephrine  infusion  0030 -patient is febrile, family believes ibuprofen  works better for the patient.  No contraindications, ibuprofen  per tube for fever  0503 -unable to maintain RASS goals/CPOT scores with intermittent fentanyl .  Initiate fentanyl  infusion  Intervention Category Intermediate Interventions: Hypotension - evaluation and management  Nolton Denis 08/13/2024, 9:54 PM

## 2024-08-13 NOTE — Sepsis Progress Note (Signed)
 Sepsis protocol monitored by eLink

## 2024-08-13 NOTE — Procedures (Signed)
 Intubation Procedure Note  Wayne Meyers  [id number]  1991-03-19  Date:08/13/2024  Time:5:49 PM   Provider Performing:Carolyna Yerian Ruthine    Procedure: Intubation (31500)  Indication(s) Respiratory Failure  Consent Risks of the procedure as well as the alternatives and risks of each were explained to the patient and/or caregiver.  Consent for the procedure was obtained and is signed in the bedside chart   Anesthesia Etomidate , Versed , Fentanyl , and Rocuronium    Time Out Verified patient identification, verified procedure, site/side was marked, verified correct patient position, special equipment/implants available, medications/allergies/relevant history reviewed, required imaging and test results available.   Sterile Technique Usual hand hygeine, masks, and gloves were used   Procedure Description Patient positioned in bed supine.  Sedation given as noted above.  Patient was intubated with endotracheal tube using Glidescope.  View was Grade 1 full glottis .  Number of attempts was 1.  Colorimetric CO2 detector was consistent with tracheal placement.   Complications/Tolerance None; patient tolerated the procedure well. Chest X-ray is ordered to verify placement.   EBL none  Specimen(s) None   Dr. Jude available at bedside and supervised procedure.  Shefali Ng, PA-C [facility] For pager details, please see AMION or use Epic chat  After 1900, please call [facility] for cross coverage needs 08/13/2024, 5:50 PM

## 2024-08-13 NOTE — Progress Notes (Signed)
 Pt NTS per MD order.  Pt had thick tan moderate secretions via his left nare, unable to pass his right nare with catheter.  HR 145, RR 35, sats 95% on 2L [place].  Neb tx given after sucitoning. Father at bedside assisting

## 2024-08-13 NOTE — H&P (Addendum)
 " History and Physical    Patient: Wayne Meyers FMW:[id number] DOB: 01-12-1991 DOA: 08/13/2024 DOS: the patient was seen and examined on 08/13/2024 PCP: Hunter Meyers Browner, PA-C  Patient coming from: Home  Chief Complaint:  Chief Complaint  Patient presents with   Influenza   Shortness of Breath   HPI: Wayne Meyers is a 34 y.o. male with medical history significant for spastic quadriparesis, seizure disorder, cerebral palsy, dysphagia requiring G-tube, cortical blindness, severe intellectual disability, and anoxic brain injury as a child was sent to the ED from his doctor's office for hypoxia.  The patient's father reports that he developed a little bit of fever yesterday and had a little bit of wheezing last night.  Patient's dad says he was sick about 3 days ago.  This morning the patient got a breathing treatment at home and then the dad took him to the doctor's office.  At the doctor's office they found that his O2 sats were in the 80s on room air.  The patient also had started having more difficulty breathing and had more of a gurgling sound when he breathes.  He was sent by ambulance from the doctor's office to the ED.  In the ED the patient required 3 L O2 nasal cannula to keep his O2 sats in the 90s.  He did get a CT scan of his chest which revealed complete collapse of the right lower lobe with mucous plugging in the right mainstem bronchus.  The patient was also influenza A positive. At the time of my evaluation the patient was really struggling to breathe.  His vitals look stable but the patient seem to be having a hard time with the cough and looks to be in pain with coughing. The patient will be admitted to the stepdown unit.  He he may have a penicillin allergy so he was started on Levaquin .  His lactic acid level was less than 2.  He was afebrile here in the ED.  His white blood cell count is 13.7.  He did have a VBG which revealed pH of 7.42.  Prior to yesterday the patient's father  says that the patient was in his usual state of health.  His last bowel movement was yesterday.  He has been taking his nutrition per his G-tube and otherwise has seemed in his usual state of health up until yesterday.  The patient has caregivers who come to the house 7 days a week to help his father.  He has not required hospitalization in the last 20 years. He does follow with [facility] for his G-tube issues and with neurology here at [facility].  Review of Systems: unable to review all systems due to the inability of the patient to answer questions. Past Medical History:  Diagnosis Date   Cerebral palsy (HCC)    Esophageal reflux    Mental retardation    Seizure (HCC)    Seizures (HCC)    Phreesia 11/26/2020   Vision abnormalities    Past Surgical History:  Procedure Laterality Date   BUTTON CHANGE N/A 04/05/2014   Procedure: BUTTON CHANGE;  Surgeon: Wayne LITTIE Wayne Meyers., MD;  Location: [facility];  Service: Endoscopy;  Laterality: N/A;   BUTTON CHANGE N/A 01/09/2016   Procedure: BUTTON CHANGE;  Surgeon: Wayne Hint, MD;  Location: [facility];  Service: Endoscopy;  Laterality: N/A;  may need sav dilators   BUTTON CHANGE N/A 04/14/2017   Procedure: BUTTON CHANGE;  Surgeon: Wayne Jasper, MD;  Location:  [facility];  Service: Gastroenterology;  Laterality: N/A;   BUTTON CHANGE N/A 10/04/2018   Procedure: BUTTON CHANGE;  Surgeon: Wayne Jasper, MD;  Location: [facility];  Service: Gastroenterology;  Laterality: N/A;   BUTTON CHANGE N/A 06/30/2021   Procedure: BUTTON CHANGE;  Surgeon: Wayne Jasper, MD;  Location: [facility];  Service: Gastroenterology;  Laterality: N/A;   CIRCUMCISION  1990   HERNIA REPAIR N/A    Phreesia 11/26/2020   NISSEN FUNDOPLICATION  1986   PEG PLACEMENT  12/07/2011   Procedure: PERCUTANEOUS ENDOSCOPIC GASTROSTOMY (PEG) REPLACEMENT;  Surgeon: Wayne JULIANNA Fitz, MD;  Location: [facility];  Service: Endoscopy;  Laterality: N/A;  20 french 4.4 cent.   PEG PLACEMENT N/A 01/04/2013    Procedure: PERCUTANEOUS ENDOSCOPIC GASTROSTOMY (PEG) REPLACEMENT;  Surgeon: Wayne Wayne Hint, MD;  Location[facility] ENDOSCOPY;  Service: Endoscopy;  Laterality: N/A;   PEG PLACEMENT N/A 03/19/2015   Procedure: PERCUTANEOUS ENDOSCOPIC GASTROSTOMY (PEG) REPLACEMENT;  Surgeon: Wayne Sol, MD;  Location: [facility] ENDOSCOPY;  Service: Endoscopy;  Laterality: N/A;   PEG PLACEMENT N/A 09/21/2016   Procedure: PERCUTANEOUS ENDOSCOPIC GASTROSTOMY (PEG) REPLACEMENT;  Surgeon: Wayne Bohr, MD;  Location: [facility];  Service: Endoscopy;  Laterality: N/A;   PEG PLACEMENT N/A 02/13/2020   Procedure: PERCUTANEOUS ENDOSCOPIC GASTROSTOMY (PEG) REPLACEMENT;  Surgeon: Meyers Jerrell, MD;  Location: [facility];  Service: Endoscopy;  Laterality: N/A;   PEG PLACEMENT N/A 10/12/2022   Procedure: PERCUTANEOUS ENDOSCOPIC GASTROSTOMY (PEG) REPLACEMENT;  Surgeon: Wayne Jasper, MD;  Location: [facility];  Service: Gastroenterology;  Laterality: N/A;   PEG PLACEMENT  04/11/2024   Procedure: REPLACEMENT, PEG TUBE, WITHOUT ENDOSCOPY;  Surgeon: Meyers Jerrell, MD;  Location: [facility];  Service: Gastroenterology;;   Social History:  reports that he has never smoked. He has never used smokeless tobacco. He reports that he does not drink alcohol and does not use drugs.  Allergies[1]  Family History  Problem Relation Age of Onset   Seizures Mother    Migraines Mother    Seizures Brother    Other Paternal Grandfather        Died in his 41's from multiple health problems    Prior to Admission medications  Medication Sig Start Date End Date Taking? Authorizing Provider  albuterol  (PROVENTIL ) (2.5 MG/3ML) 0.083% nebulizer solution Take 2.5 mg by nebulization every 6 (six) hours as needed for wheezing or shortness of breath.   Yes [provider]  carBAMazepine  (TEGRETOL ) 100 MG/5ML suspension TAKE 15 ML BY MOUTH 3 TIMES DAILY 11/29/23   Marianna City, NP  cetirizine (ZYRTEC) 10 MG tablet Take 10 mg by mouth  daily.    [provider]  chlorpheniramine (CHLOR-TRIMETON) 4 MG tablet Place 2 mg into feeding tube 3 (three) times daily.    [provider]  diphenhydrAMINE  HCl 50 MG/30ML LIQD Take 33 mg by mouth at bedtime.    [provider]  glycopyrrolate  (ROBINUL ) 1 MG tablet Take 1 tablet (1 mg total) by mouth 3 (three) times daily. 11/29/23   Marianna City, NP  Melatonin 10 MG CAPS Take by mouth. 1/2 at night    [provider]  Nutritional Supplements (FEEDING SUPPLEMENT, JEVITY 1.5 CAL,) LIQD Place 237 mLs into feeding tube 4 (four) times daily.    [provider]  valproic  acid (DEPAKENE ) 250 MG/5ML solution GIVE 15 MLS BY G-TUBE 3 TIMES PER DAY 11/29/23   Marianna City, NP    Physical Exam: Vitals:   08/13/24 1042 08/13/24 1044 08/13/24 1445  BP: (!) 136/100  136/84  Pulse:   (!) 137  Resp: 20  (!) 27  Temp: 98.3 F (36.8 C)  99.4 F (37.4 C)  TempSrc: Oral  Oral  SpO2: (!) 89%  94%  Weight:  62.1 kg    Physical Exam:  General: Acute respiratory distress, cerebral palsey HEENT: Normocephalic, atraumatic, PERRL Cardiovascular: tachy Pulmonary: gurgling noises audible from door, frequent cough. distressed Gastrointestinal: Nondistended abdomen, soft, non-tender, normoactive bowel sounds, j tube button With wet gauze around it.  Musculoskeletal: atrophied legs Skin: Skin is warm and dry. Neuro: Eyes open.  He looks around the ceiling and room but does not make eye contact.  He does hear are voices but is not communicative PSYCH: Nonverbal  Data Reviewed:  Results for orders placed or performed during the hospital encounter of 08/13/24 (from the past 24 hours)  Resp panel by RT-PCR (RSV, Flu A&B, Covid) Anterior Nasal Swab     Status: Abnormal   Collection Time: 08/13/24 11:38 AM   Specimen: Anterior Nasal Swab  Result Value Ref Range   SARS Coronavirus 2 by RT PCR NEGATIVE NEGATIVE   Influenza A by PCR POSITIVE (A) NEGATIVE    Influenza B by PCR NEGATIVE NEGATIVE   Resp Syncytial Virus by PCR NEGATIVE NEGATIVE  CBC with Differential/Platelet     Status: Abnormal   Collection Time: 08/13/24 12:15 PM  Result Value Ref Range   WBC 13.7 (H) 4.0 - 10.5 K/uL   RBC 4.46 4.22 - 5.81 MIL/uL   Hemoglobin 13.3 13.0 - 17.0 g/dL   HCT 60.1 60.9 - 47.9 %   MCV 89.2 80.0 - 100.0 fL   MCH 29.8 26.0 - 34.0 pg   MCHC 33.4 30.0 - 36.0 g/dL   RDW 86.5 88.4 - 84.4 %   Platelets 136 (L) 150 - 400 K/uL   nRBC 0.0 0.0 - 0.2 %   Neutrophils Relative % 69 %   Neutro Abs 9.4 (H) 1.7 - 7.7 K/uL   Lymphocytes Relative 8 %   Lymphs Abs 1.1 0.7 - 4.0 K/uL   Monocytes Relative 23 %   Monocytes Absolute 3.1 (H) 0.1 - 1.0 K/uL   Eosinophils Relative 0 %   Eosinophils Absolute 0.0 0.0 - 0.5 K/uL   Basophils Relative 0 %   Basophils Absolute 0.0 0.0 - 0.1 K/uL   WBC Morphology See Note    RBC Morphology MORPHOLOGY UNREMARKABLE    Smear Review Normal platelet morphology    Immature Granulocytes 0 %   Abs Immature Granulocytes 0.03 0.00 - 0.07 K/uL  Blood gas, venous     Status: Abnormal   Collection Time: 08/13/24 12:15 PM  Result Value Ref Range   pH, Ven 7.42 7.25 - 7.43   pCO2, Ven 52 44 - 60 mmHg   pO2, Ven 49 (H) 32 - 45 mmHg   Bicarbonate 33.7 (H) 20.0 - 28.0 mmol/L   Acid-Base Excess 7.6 (H) 0.0 - 2.0 mmol/L   O2 Saturation 84.8 %   Patient temperature 37.0   I-Stat Lactic Acid     Status: None   Collection Time: 08/13/24 12:27 PM  Result Value Ref Range   Lactic Acid, Venous 1.0 0.5 - 1.9 mmol/L  I-Stat Chem 8, ED     Status: Abnormal   Collection Time: 08/13/24 12:28 PM  Result Value Ref Range   Sodium 130 (L) 135 - 145 mmol/L   Potassium 3.8 3.5 - 5.1 mmol/L   Chloride 92 (L) 98 - 111 mmol/L   BUN  5 (L) 6 - 20 mg/dL   Creatinine, Ser 9.69 (L) 0.61 - 1.24 mg/dL   Glucose, Bld 96 70 - 99 mg/dL   Calcium, Ion 8.83 8.84 - 1.40 mmol/L   TCO2 28 22 - 32 mmol/L   Hemoglobin 13.6 13.0 - 17.0 g/dL   HCT 59.9 60.9 -  47.9 %     Assessment and Plan: Acute respiratory failure with hypoxia, now requiring 3 L O2 nasal cannula / influenza A/ mucous plugging with right lower lobe collapse - Pulmonary critical care consulted  - Continue oxygen by nasal cannula as needed - Aggressive management of mucous plugging with chest PT and percussion bed  2.  Seizure disorder -continue routine seizure medications.  The patient has not had a seizure in quite some time.  3.  Cerebral palsy/anoxic brain injury/J-tube placement  - Continue nutrition per tube - The patient requires full care  4. Hyponatremia - the patient did receive IV fluids in the emergency department.  Will follow-up. Will hold IV fluids for now due to gurgling.   Advance Care Planning:   Code Status: Not on file the patient's father is his primary caregiver and his full legal guardian.  He says the patient is full code.  Consults: Pulmonology/critical care  Family Communication: Father and guardian at bedside  Severity of Illness: The appropriate patient status for this patient is INPATIENT. Inpatient status is judged to be reasonable and necessary in order to provide the required intensity of service to ensure the patient's safety. The patient's presenting symptoms, physical exam findings, and initial radiographic and laboratory data in the context of their chronic comorbidities is felt to place them at high risk for further clinical deterioration. Furthermore, it is not anticipated that the patient will be medically stable for discharge from the hospital within 2 midnights of admission.   * I certify that at the point of admission it is my clinical judgment that the patient will require inpatient hospital care spanning beyond 2 midnights from the point of admission due to high intensity of service, high risk for further deterioration and high frequency of surveillance required.*  Author: ARTHEA CHILD, MD 08/13/2024 3:19 PM  For on call  review [email or web address].      [1]  Allergies Allergen Reactions   Augmentin [Amoxicillin-Pot Clavulanate] Diarrhea   Amoxicillin Other (See Comments)    Childhood allergy- no reaction noted   Latex Rash   Powders [Talc] Rash and Other (See Comments)    From powdered gloves   "

## 2024-08-14 ENCOUNTER — Inpatient Hospital Stay (HOSPITAL_COMMUNITY): Payer: MEDICAID

## 2024-08-14 DIAGNOSIS — J09X1 Influenza due to identified novel influenza A virus with pneumonia: Secondary | ICD-10-CM | POA: Diagnosis not present

## 2024-08-14 DIAGNOSIS — A419 Sepsis, unspecified organism: Secondary | ICD-10-CM | POA: Diagnosis not present

## 2024-08-14 DIAGNOSIS — T17500A Unspecified foreign body in bronchus causing asphyxiation, initial encounter: Secondary | ICD-10-CM

## 2024-08-14 DIAGNOSIS — G809 Cerebral palsy, unspecified: Secondary | ICD-10-CM

## 2024-08-14 DIAGNOSIS — J9811 Atelectasis: Secondary | ICD-10-CM | POA: Diagnosis not present

## 2024-08-14 DIAGNOSIS — J9601 Acute respiratory failure with hypoxia: Secondary | ICD-10-CM | POA: Diagnosis not present

## 2024-08-14 DIAGNOSIS — R6521 Severe sepsis with septic shock: Secondary | ICD-10-CM

## 2024-08-14 LAB — CBC
HCT: 33.7 % — ABNORMAL LOW (ref 39.0–52.0)
Hemoglobin: 11.7 g/dL — ABNORMAL LOW (ref 13.0–17.0)
MCH: 30.7 pg (ref 26.0–34.0)
MCHC: 34.7 g/dL (ref 30.0–36.0)
MCV: 88.5 fL (ref 80.0–100.0)
Platelets: 125 K/uL — ABNORMAL LOW (ref 150–400)
RBC: 3.81 MIL/uL — ABNORMAL LOW (ref 4.22–5.81)
RDW: 13.4 % (ref 11.5–15.5)
WBC: 10.5 K/uL (ref 4.0–10.5)
nRBC: 0 % (ref 0.0–0.2)

## 2024-08-14 LAB — MRSA NEXT GEN BY PCR, NASAL: MRSA by PCR Next Gen: NOT DETECTED

## 2024-08-14 LAB — BASIC METABOLIC PANEL WITH GFR
Anion gap: 12 (ref 5–15)
BUN: 14 mg/dL (ref 6–20)
CO2: 23 mmol/L (ref 22–32)
Calcium: 8.4 mg/dL — ABNORMAL LOW (ref 8.9–10.3)
Chloride: 95 mmol/L — ABNORMAL LOW (ref 98–111)
Creatinine, Ser: 0.51 mg/dL — ABNORMAL LOW (ref 0.61–1.24)
GFR, Estimated: >60 mL/min
Glucose, Bld: 88 mg/dL (ref 70–99)
Potassium: 4.5 mmol/L (ref 3.5–5.1)
Sodium: 129 mmol/L — ABNORMAL LOW (ref 135–145)

## 2024-08-14 LAB — GLUCOSE, CAPILLARY
Glucose-Capillary: 45 mg/dL — ABNORMAL LOW (ref 70–99)
Glucose-Capillary: 69 mg/dL — ABNORMAL LOW (ref 70–99)
Glucose-Capillary: 83 mg/dL (ref 70–99)

## 2024-08-14 LAB — MAGNESIUM: Magnesium: 2 mg/dL (ref 1.7–2.4)

## 2024-08-14 LAB — PATHOLOGIST SMEAR REVIEW

## 2024-08-14 LAB — PHOSPHORUS: Phosphorus: 3.6 mg/dL (ref 2.5–4.6)

## 2024-08-14 MED ORDER — DEXTROSE 50 % IV SOLN
25.0000 g | INTRAVENOUS | Status: AC
  Administered 2024-08-15: 25 g via INTRAVENOUS
  Filled 2024-08-14: qty 50

## 2024-08-14 MED ORDER — OSELTAMIVIR PHOSPHATE 6 MG/ML PO SUSR
75.0000 mg | Freq: Two times a day (BID) | ORAL | Status: AC
  Administered 2024-08-14 – 2024-08-18 (×8): 75 mg
  Filled 2024-08-14 (×8): qty 12.5

## 2024-08-14 MED ORDER — VITAL 1.5 CAL PO LIQD
1000.0000 mL | ORAL | Status: DC
  Administered 2024-08-14: 1000 mL
  Filled 2024-08-14: qty 1000

## 2024-08-14 MED ORDER — PHENYLEPHRINE 80 MCG/ML (10ML) SYRINGE FOR IV PUSH (FOR BLOOD PRESSURE SUPPORT)
PREFILLED_SYRINGE | INTRAVENOUS | Status: AC
  Filled 2024-08-14: qty 10

## 2024-08-14 MED ORDER — LACTATED RINGERS IV SOLN: INTRAVENOUS | Status: AC

## 2024-08-14 MED ORDER — CLONAZEPAM 0.5 MG PO TABS: 0.5000 mg | ORAL_TABLET | Freq: Two times a day (BID) | ORAL | Status: DC

## 2024-08-14 MED ORDER — ETOMIDATE 2 MG/ML IV SOLN
20.0000 mg | Freq: Once | INTRAVENOUS | Status: AC
  Administered 2024-08-14: 20 mg via INTRAVENOUS

## 2024-08-14 MED ORDER — FENTANYL BOLUS VIA INFUSION
25.0000 ug | INTRAVENOUS | Status: DC | PRN
  Administered 2024-08-14 (×8): 100 ug via INTRAVENOUS
  Administered 2024-08-14: 50 ug via INTRAVENOUS
  Administered 2024-08-14 – 2024-08-15 (×11): 100 ug via INTRAVENOUS
  Administered 2024-08-15: 50 ug via INTRAVENOUS
  Administered 2024-08-16 – 2024-08-21 (×34): 100 ug via INTRAVENOUS
  Administered 2024-08-21: 50 ug via INTRAVENOUS
  Administered 2024-08-21: 100 ug via INTRAVENOUS
  Administered 2024-08-21: 50 ug via INTRAVENOUS
  Administered 2024-08-21 – 2024-08-23 (×7): 100 ug via INTRAVENOUS
  Administered 2024-08-23: 50 ug via INTRAVENOUS
  Administered 2024-08-23: 100 ug via INTRAVENOUS

## 2024-08-14 MED ORDER — ORAL CARE MOUTH RINSE: 15.0000 mL | OROMUCOSAL | Status: DC | PRN

## 2024-08-14 MED ORDER — DEXTROSE 50 % IV SOLN
12.5000 g | INTRAVENOUS | Status: AC
  Administered 2024-08-14: 12.5 g via INTRAVENOUS
  Filled 2024-08-14: qty 50

## 2024-08-14 MED ORDER — ORAL CARE MOUTH RINSE
15.0000 mL | OROMUCOSAL | Status: DC
  Administered 2024-08-14 – 2024-08-23 (×110): 15 mL via OROMUCOSAL

## 2024-08-14 MED ORDER — FENTANYL 2500MCG IN NS 250ML (10MCG/ML) PREMIX INFUSION
0.0000 ug/h | INTRAVENOUS | Status: DC
  Administered 2024-08-14: 50 ug/h via INTRAVENOUS
  Administered 2024-08-14 – 2024-08-15 (×2): 150 ug/h via INTRAVENOUS
  Administered 2024-08-15: 175 ug/h via INTRAVENOUS
  Administered 2024-08-16: 275 ug/h via INTRAVENOUS
  Administered 2024-08-16: 250 ug/h via INTRAVENOUS
  Administered 2024-08-16: 225 ug/h via INTRAVENOUS
  Administered 2024-08-17 – 2024-08-18 (×4): 200 ug/h via INTRAVENOUS
  Administered 2024-08-19: 275 ug/h via INTRAVENOUS
  Administered 2024-08-19: 200 ug/h via INTRAVENOUS
  Administered 2024-08-19 – 2024-08-20 (×2): 300 ug/h via INTRAVENOUS
  Administered 2024-08-20 – 2024-08-23 (×12): 400 ug/h via INTRAVENOUS
  Filled 2024-08-14 (×27): qty 250

## 2024-08-14 MED ORDER — IBUPROFEN 200 MG PO TABS
600.0000 mg | ORAL_TABLET | Freq: Once | ORAL | Status: AC
  Administered 2024-08-14: 600 mg
  Filled 2024-08-14: qty 3

## 2024-08-14 MED ORDER — ETOMIDATE 2 MG/ML IV SOLN
INTRAVENOUS | Status: AC
  Filled 2024-08-14: qty 20

## 2024-08-14 MED ORDER — LACTATED RINGERS IV BOLUS
1000.0000 mL | Freq: Once | INTRAVENOUS | Status: AC
  Administered 2024-08-14: 1000 mL via INTRAVENOUS

## 2024-08-14 MED ORDER — ROCURONIUM BROMIDE 10 MG/ML (PF) SYRINGE: 50.0000 mg | PREFILLED_SYRINGE | Freq: Once | INTRAVENOUS | Status: AC

## 2024-08-14 MED ORDER — CLONAZEPAM 0.5 MG PO TABS
0.5000 mg | ORAL_TABLET | Freq: Two times a day (BID) | ORAL | Status: DC
  Administered 2024-08-14: 0.5 mg
  Filled 2024-08-14: qty 1

## 2024-08-14 MED ORDER — CHLORHEXIDINE GLUCONATE CLOTH 2 % EX PADS
6.0000 | MEDICATED_PAD | Freq: Every day | CUTANEOUS | Status: DC
  Administered 2024-08-14 – 2024-08-22 (×9): 6 via TOPICAL

## 2024-08-14 MED ORDER — FENTANYL CITRATE (PF) 50 MCG/ML IJ SOSY: 25.0000 ug | PREFILLED_SYRINGE | Freq: Once | INTRAMUSCULAR | Status: DC

## 2024-08-14 MED ORDER — FREE WATER
200.0000 mL | Freq: Four times a day (QID) | Status: DC
  Administered 2024-08-15 – 2024-08-16 (×6): 200 mL

## 2024-08-14 MED ORDER — ROCURONIUM BROMIDE 10 MG/ML (PF) SYRINGE
PREFILLED_SYRINGE | INTRAVENOUS | Status: AC
  Administered 2024-08-14: 50 mg via INTRAVENOUS
  Filled 2024-08-14: qty 10

## 2024-08-14 NOTE — Plan of Care (Signed)
" °  Problem: Clinical Measurements: Goal: Diagnostic test results will improve Outcome: Not Progressing Goal: Respiratory complications will improve Outcome: Not Progressing Goal: Cardiovascular complication will be avoided Outcome: Not Progressing   Problem: Activity: Goal: Risk for activity intolerance will decrease Outcome: Not Progressing   Problem: Nutrition: Goal: Adequate nutrition will be maintained Outcome: Not Progressing   Problem: Elimination: Goal: Will not experience complications related to urinary retention Outcome: Not Progressing   "

## 2024-08-14 NOTE — Procedures (Signed)
 Intubation Procedure Note  Wayne Meyers  [id number]  1991/04/14  Date:08/14/2024  Time:5:53 PM   Provider Performing:Chioma Mukherjee Ruthine    Procedure: Intubation (31500)  Indication(s) Respiratory Failure  Consent Risks of the procedure as well as the alternatives and risks of each were explained to the patient and/or caregiver.  Consent for the procedure was obtained and is signed in the bedside chart   Anesthesia Etomidate , Fentanyl , and Rocuronium    Time Out Verified patient identification, verified procedure, site/side was marked, verified correct patient position, special equipment/implants available, medications/allergies/relevant history reviewed, required imaging and test results available.   Sterile Technique Usual hand hygeine, masks, and gloves were used   Procedure Description Patient positioned in bed supine.  Sedation given as noted above.  Patient was intubated with endotracheal tube using Glidescope.  View was Grade 1 full glottis .  Number of attempts was 1.  Colorimetric CO2 detector was consistent with tracheal placement.   Complications/Tolerance None; patient tolerated the procedure well. Chest X-ray is ordered to verify placement.   EBL none   Specimen(s) None   ET tube pulled out, SPO2 dropped to 50-60%, was then oxygenated by BVM, decision made to re-intubate for airway protection. Dr. Jude available at bedside and supervised procedure.  Alaijah Gibler, PA-C [facility] For pager details, please see AMION or use Epic chat  After 1900, please call [facility] for cross coverage needs 08/14/2024, 5:54 PM

## 2024-08-14 NOTE — Progress Notes (Addendum)
 Initial Nutrition Assessment  DOCUMENTATION CODES:   Not applicable  INTERVENTION:  - Initiate Vital 1.5 @ 6mL/hr continuous via G-tube.  - Monitor magnesium, potassium, and phosphorus daily for at least 3 days, MD to replete as needed.  - Once able to advance past trickle tube feeds, recommend: Vital 1.5 at 30 ml/h (720 ml per day) Provides 1080 kcal, 49 gm protein, 550 ml free water  daily  - FWF per CCM. Currently ordered 200mL Q4H (1249mL/day)  - Will monitor for ability to transition back to patient's home tube feed formula/regimen.   NUTRITION DIAGNOSIS:   Inadequate oral intake related to inability to eat as evidenced by NPO status.  GOAL:   Patient will meet greater than or equal to 90% of their needs  MONITOR:   Vent status, Labs, Weight trends, TF tolerance  REASON FOR ASSESSMENT:   Consult Enteral/tube feeding initiation and management  ASSESSMENT:   34 yo M with PMH CP, spastic quadriparesis, cortical blindness, anoxic brain injury, sz disorder, dysphagia G tube in situ who presented for eval of SOB + hypoxia + Flu A positive.  1/6 Admit; Intubated  Patient is currently intubated on ventilator support MV: 7.1 L/min Temp (24hrs), Avg:99.8 F (37.7 C), Min:97.8 F (36.6 C), Max:101.2 F (38.4 C)  Dad at bedside and provided all nutrition history.   Dad unsure of a UBW as he reports he doesn't really weigh the patient at home. Notes he feels his weight has been stable.  Per EMR, limited weight history. Weight taken this admission is the same as weight taken in April 2024, question if this was copied over. RD utilized bed scale to obtain a weight which resulted in 60.4kg (133#) and dad reports this is likely accurate if not a little high.  Dad confirms the patient does not eat anything by mouth and instead uses G-tube for nutrition. TF regimen provided by dad is Jevity 1.5, amount/times as below: 60mL @ 0700 + 12 oz of water  (1 carton) @ 1000 +  12 oz of water  (1 carton) @ 1400 + 12 oz of water  90mL @ 1800 + 12 oz of water  60mL @ 2100 + 12 oz of water  + 4 oz of activa yogurt daily (~80 kcals, 4g protein)  TF regimen provides: ~1106 kcals, 47g protein, and free water  (formula + FWF)   Dad reports patient tolerates tube feed regimen well at home.  Discussed trying elemental formula for now that is lower in fiber (Vital 1.5) while patient on the ventilator. Also discussed keeping patient on continuous feeds for now. Patient's dad agreeable to plan.   Discussed with CCM. Plan for trickles only today. Goal TF recs as above.    Medications reviewed and include: Colace BID, Miralax  daily, 200mL Q4H FWF Precedex  Fentanyl  Levophed  @ 4 mcg/min  Labs reviewed:  Na 129  NUTRITION - FOCUSED PHYSICAL EXAM:  Flowsheet Row Most Recent Value  Orbital Region No depletion  Upper Arm Region No depletion  Thoracic and Lumbar Region No depletion  Buccal Region No depletion  Temple Region No depletion  Clavicle Bone Region No depletion  Clavicle and Acromion Bone Region No depletion  Scapular Bone Region Unable to assess  Dorsal Hand Unable to assess  Patellar Region Unable to assess  Anterior Thigh Region Unable to assess  Posterior Calf Region Unable to assess  Edema (RD Assessment) None  Hair Reviewed  Eyes Unable to assess  Mouth Unable to assess  Skin Reviewed  Nails Reviewed  Diet Order:   Diet Order             Diet NPO time specified  Diet effective now                   EDUCATION NEEDS:  Education needs have been addressed  Skin:  Skin Assessment: Reviewed RN Assessment  Last BM:  PTA  Height:  Ht Readings from Last 1 Encounters:  No data found for Ht   Weight:  Wt Readings from Last 1 Encounters:  08/13/24 62.1 kg  *60.4kg (per bedscale 1/6)   BMI:  There is no height or weight on file to calculate BMI.  Estimated Nutritional Needs:  Kcal:  1000-1200 kcals Protein:  50-60  grams Fluid:  >/= 1.5L    Trude Ned RD, LDN Contact via Secure Chat.

## 2024-08-14 NOTE — Progress Notes (Signed)
 Patient had an episode of agtiation/thrashing. During that episode the patient dislodged his endotracheal tube, in which it had pulled mostly out. Oxygen saturations dropped to 50 percent and patient was bag ventilated until tube could be fully taken out. When patient demonstrated that he would not be able to tolerate being off the ventilator, he was intubated.

## 2024-08-14 NOTE — Consult Note (Signed)
 "  NAME:  Wayne Meyers, MRN:  [id number], DOB:  March 03, 1991, LOS: 1 ADMISSION DATE:  08/13/2024, CONSULTATION DATE:  08/13/24 REFERRING MD:  Arthea , CHIEF COMPLAINT:  SOB   History of Present Illness:  34 yo M PMH CP, spastic quadriparesis, cortical blindness, anoxic brain injury, sz disorder, dysphagia G tube in situ who presented to [facility] ED 08/13/24 at referral of PCP for eval of SOB + hypoxia + Flu A positive.  CXR in ED w elevated R hemidiaphragm. CT chest with debris in airway, R mucus plugging. On levaquin , tamiflu . Due to worsening respiratory status patient intubated after admission.  Pertinent  Medical History  CP Spastic quadriplegia Anoxic brain injury  Cortical blindness  Sz disorder G tube in situ  Significant Hospital Events: Including procedures, antibiotic start and stop dates in addition to other pertinent events   08/13/24 admitted to [facility] with PCCM consult. Patient intubated on evaluation and switched to PCCM service  Interim History / Subjective:  Patient sedated and on ventilator  Objective    Blood pressure (!) 139/38, pulse 73, temperature 98.6 F (37 C), temperature source Axillary, resp. rate (!) 22, weight 62.1 kg, SpO2 100%.    Vent Mode: PRVC FiO2 (%):  [40 %-100 %] 40 % Set Rate:  [22 bmp] 22 bmp Vt Set:  [340 mL] 340 mL PEEP:  [5 cmH20] 5 cmH20 Plateau Pressure:  [24 cmH20-26 cmH20] 26 cmH20   Intake/Output Summary (Last 24 hours) at 08/14/2024 9082 Last data filed at 08/14/2024 9083 Gross per 24 hour  Intake 1898.46 ml  Output --  Net 1898.46 ml   Filed Weights   08/13/24 1044  Weight: 62.1 kg    Examination: General: chronically and acutely ill male, on ventilator HENT: Anicteric sclera pink mm poor dentition Lungs: scattered rhonchi, improving Cardiovascular: tachycardic  Abdomen: G tube  Extremities: no acute appearing joint deformity, chronic contractures  Neuro: Does not follow commands, sedated on ventilator  Resolved problem list    Assessment and Plan   Acute Hypoxic Respiratory Failure secondary to Influenza A and right lower lobe collapse/mucus plugging Sepsis secondary to above -Intubated with bronchoscopy 1/5 -On precedex  and fentanyl , attempt to wean today -Chest PT; Hypertonic saline nebs BID, PRN albuterol   -Levaquin , Tamiflu  (1/5)  -On levophed  , wean as able -Repeat CXR today  Cerebral palsy Cognitive delay  Spastic quadriparesis  Cortical blindness G tube in situ  Hx anoxic brain injury Hx seizure disorder  - Tegretol  and depakene  home medications - Trickle tube feeds ordered     Review of Systems:   Unable to obtain 2/2 baseline cognitive delay   Past Medical History:  He,  has a past medical history of Cerebral palsy (HCC), Esophageal reflux, Mental retardation, Seizure (HCC), Seizures (HCC), and Vision abnormalities.   Surgical History:   Past Surgical History:  Procedure Laterality Date   BUTTON CHANGE N/A 04/05/2014   Procedure: BUTTON CHANGE;  Surgeon: Lynwood LITTIE Celestia Mickey., MD;  Location: [facility];  Service: Endoscopy;  Laterality: N/A;   BUTTON CHANGE N/A 01/09/2016   Procedure: BUTTON CHANGE;  Surgeon: Norleen Hint, MD;  [facility];  Service: Endoscopy;  Laterality: N/A;  may need sav dilators   BUTTON CHANGE N/A 04/14/2017   Procedure: BUTTON CHANGE;  Surgeon: Saintclair Jasper, MD;  [facility];  Service: Gastroenterology;  Laterality: N/A;   BUTTON CHANGE N/A 10/04/2018   Procedure: BUTTON CHANGE;  Surgeon: Saintclair Jasper, MD;  [facility];  Service: Gastroenterology;  Laterality: N/A;   BUTTON CHANGE N/A 06/30/2021   Procedure: BUTTON CHANGE;  Surgeon: Saintclair Jasper, MD;  [facility];  Service: Gastroenterology;  Laterality: N/A;   CIRCUMCISION  1990   HERNIA REPAIR N/A    Phreesia 11/26/2020   NISSEN FUNDOPLICATION  1986   PEG PLACEMENT  12/07/2011   Procedure: PERCUTANEOUS ENDOSCOPIC GASTROSTOMY (PEG) REPLACEMENT;  Surgeon: Lesta JULIANNA Fitz,  MD;  [facility];  Service: Endoscopy;  Laterality: N/A;  20 french 4.4 cent.   PEG PLACEMENT N/A 01/04/2013   Procedure: PERCUTANEOUS ENDOSCOPIC GASTROSTOMY (PEG) REPLACEMENT;  Surgeon: Norleen JAYSON Hint, MD;  Location: [facility]COPY;  Service: Endoscopy;  Laterality: N/A;   PEG PLACEMENT N/A 03/19/2015   Procedure: PERCUTANEOUS ENDOSCOPIC GASTROSTOMY (PEG) REPLACEMENT;  Surgeon: Jerrell Sol, MD;  Location: [facility] ENDOSCOPY;  Service: Endoscopy;  Laterality: N/A;   PEG PLACEMENT N/A 09/21/2016   Procedure: PERCUTANEOUS ENDOSCOPIC GASTROSTOMY (PEG) REPLACEMENT;  Surgeon: Lynwood Bohr, MD;  [facility];  Service: Endoscopy;  Laterality: N/A;   PEG PLACEMENT N/A 02/13/2020   Procedure: PERCUTANEOUS ENDOSCOPIC GASTROSTOMY (PEG) REPLACEMENT;  Surgeon: Sol Jerrell, MD;  [facility];  Service: Endoscopy;  Laterality: N/A;   PEG PLACEMENT N/A 10/12/2022   Procedure: PERCUTANEOUS ENDOSCOPIC GASTROSTOMY (PEG) REPLACEMENT;  Surgeon: Saintclair Jasper, MD;  [facility];  Service: Gastroenterology;  Laterality: N/A;   PEG PLACEMENT  04/11/2024   Procedure: REPLACEMENT, PEG TUBE, WITHOUT ENDOSCOPY;  Surgeon: Sol Jerrell, MD;  [facility];  Service: Gastroenterology;;     Social History:   reports that he has never smoked. He has never used smokeless tobacco. He reports that he does not drink alcohol and does not use drugs.   Family History:  His family history includes Migraines in his mother; Other in his paternal grandfather; Seizures in his brother and mother.   Allergies Allergies[1]   Home Medications  Prior to Admission medications  Medication Sig Start Date End Date Taking? Authorizing Provider  albuterol  (PROVENTIL ) (2.5 MG/3ML) 0.083% nebulizer solution Take 2.5 mg by nebulization every 6 (six) hours as needed for wheezing or shortness of breath.   Yes [provider]  carBAMazepine  (TEGRETOL ) 100 MG/5ML suspension TAKE 15 ML BY MOUTH 3  TIMES DAILY Patient taking differently: Place 300 mg into feeding tube in the morning, at noon, and at bedtime. 11/29/23  Yes Marianna City, NP  cetirizine (ZYRTEC) 10 MG tablet Place 10 mg into feeding tube See admin instructions. CRUSH 10 mg and administer 10 mg, PER TUBE, at bedtime   Yes [provider]  chlorpheniramine (CHLOR-TRIMETON) 4 MG tablet Place 2 mg into feeding tube See admin instructions. CRUSH 2 mg and administer, PER TUBE, three times a day   Yes [provider]  diphenhydrAMINE  HCl 50 MG/30ML LIQD Place 50 mg into feeding tube at bedtime.   Yes [provider]  glycopyrrolate  (ROBINUL ) 1 MG tablet Take 1 tablet (1 mg total) by mouth 3 (three) times daily. Patient taking differently: Place 1 mg into feeding tube in the morning, at noon, and at bedtime. 11/29/23  Yes Marianna City, NP  ibuprofen  (ADVIL ) 100 MG/5ML suspension Take 400 mg by mouth every 6 (six) hours as needed for mild pain (pain score 1-3) or fever.   Yes [provider]  Melatonin 1 MG SUBL Place 3-5 mg into feeding tube at bedtime.   Yes [provider]  NON FORMULARY Place 4 oz into feeding tube See admin instructions. Activia yogurt, diluted - 4 ounces, per  tube, at 6 PM   Yes [provider]  Nutritional Supplements (FEEDING SUPPLEMENT, JEVITY 1.5 CAL,) LIQD Place 237 mLs into feeding tube 4 (four) times daily.    [provider]  valproic  acid (DEPAKENE ) 250 MG/5ML solution GIVE 15 MLS BY G-TUBE 3 TIMES PER DAY Patient taking differently: Place 750 mg into feeding tube See admin instructions. 11/29/23   Marianna City, NP     Critical care time: 30 min    Keven Fila, PA-C [place] [facility] For pager details, please see AMION or use Epic chat  After 1900, please call [facility] for cross coverage needs 08/14/2024, 9:28 AM         [1]  Allergies Allergen Reactions   Augmentin [Amoxicillin-Pot Clavulanate]  Diarrhea   Amoxicillin Other (See Comments)    Childhood allergy- no reaction noted   Latex Rash and Other (See Comments)    No powdered gloves!!!!   Powders [Talc] Rash and Other (See Comments)    From powdered gloves   "

## 2024-08-15 ENCOUNTER — Inpatient Hospital Stay (HOSPITAL_COMMUNITY): Payer: MEDICAID

## 2024-08-15 DIAGNOSIS — J09X1 Influenza due to identified novel influenza A virus with pneumonia: Secondary | ICD-10-CM | POA: Diagnosis not present

## 2024-08-15 DIAGNOSIS — A419 Sepsis, unspecified organism: Secondary | ICD-10-CM | POA: Diagnosis not present

## 2024-08-15 DIAGNOSIS — J9811 Atelectasis: Secondary | ICD-10-CM | POA: Diagnosis not present

## 2024-08-15 DIAGNOSIS — J9601 Acute respiratory failure with hypoxia: Secondary | ICD-10-CM | POA: Diagnosis not present

## 2024-08-15 LAB — BASIC METABOLIC PANEL WITH GFR
Anion gap: 14 (ref 5–15)
BUN: 13 mg/dL (ref 6–20)
CO2: 22 mmol/L (ref 22–32)
Calcium: 8.3 mg/dL — ABNORMAL LOW (ref 8.9–10.3)
Chloride: 95 mmol/L — ABNORMAL LOW (ref 98–111)
Creatinine, Ser: 0.56 mg/dL — ABNORMAL LOW (ref 0.61–1.24)
GFR, Estimated: >60 mL/min
Glucose, Bld: 92 mg/dL (ref 70–99)
Potassium: 4.5 mmol/L (ref 3.5–5.1)
Sodium: 131 mmol/L — ABNORMAL LOW (ref 135–145)

## 2024-08-15 LAB — GLUCOSE, CAPILLARY
Glucose-Capillary: 106 mg/dL — ABNORMAL HIGH (ref 70–99)
Glucose-Capillary: 117 mg/dL — ABNORMAL HIGH (ref 70–99)
Glucose-Capillary: 131 mg/dL — ABNORMAL HIGH (ref 70–99)
Glucose-Capillary: 151 mg/dL — ABNORMAL HIGH (ref 70–99)
Glucose-Capillary: 152 mg/dL — ABNORMAL HIGH (ref 70–99)
Glucose-Capillary: 183 mg/dL — ABNORMAL HIGH (ref 70–99)
Glucose-Capillary: 75 mg/dL (ref 70–99)

## 2024-08-15 LAB — CBC WITH DIFFERENTIAL/PLATELET
Abs Immature Granulocytes: 0.09 K/uL — ABNORMAL HIGH (ref 0.00–0.07)
Basophils Absolute: 0 K/uL (ref 0.0–0.1)
Basophils Relative: 0 %
Eosinophils Absolute: 0 K/uL (ref 0.0–0.5)
Eosinophils Relative: 0 %
HCT: 30.9 % — ABNORMAL LOW (ref 39.0–52.0)
Hemoglobin: 10.4 g/dL — ABNORMAL LOW (ref 13.0–17.0)
Immature Granulocytes: 1 %
Lymphocytes Relative: 14 %
Lymphs Abs: 1.6 K/uL (ref 0.7–4.0)
MCH: 30.1 pg (ref 26.0–34.0)
MCHC: 33.7 g/dL (ref 30.0–36.0)
MCV: 89.3 fL (ref 80.0–100.0)
Monocytes Absolute: 2.6 K/uL — ABNORMAL HIGH (ref 0.1–1.0)
Monocytes Relative: 22 %
Neutro Abs: 7.7 K/uL (ref 1.7–7.7)
Neutrophils Relative %: 63 %
Platelets: 109 K/uL — ABNORMAL LOW (ref 150–400)
RBC: 3.46 MIL/uL — ABNORMAL LOW (ref 4.22–5.81)
RDW: 13.7 % (ref 11.5–15.5)
WBC: 12.1 K/uL — ABNORMAL HIGH (ref 4.0–10.5)
nRBC: 0 % (ref 0.0–0.2)

## 2024-08-15 LAB — MAGNESIUM: Magnesium: 2 mg/dL (ref 1.7–2.4)

## 2024-08-15 LAB — PHOSPHORUS: Phosphorus: 2.9 mg/dL (ref 2.5–4.6)

## 2024-08-15 MED ORDER — ONDANSETRON HCL 4 MG/2ML IJ SOLN: 4.0000 mg | Freq: Four times a day (QID) | INTRAMUSCULAR | Status: DC | PRN

## 2024-08-15 MED ORDER — DIPHENHYDRAMINE HCL 50 MG/ML IJ SOLN
50.0000 mg | Freq: Once | INTRAMUSCULAR | Status: AC
  Administered 2024-08-15: 50 mg via INTRAVENOUS
  Filled 2024-08-15: qty 1

## 2024-08-15 MED ORDER — DEXTROSE IN LACTATED RINGERS 5 % IV SOLN: INTRAVENOUS | Status: AC

## 2024-08-15 MED ORDER — QUETIAPINE FUMARATE 100 MG PO TABS
100.0000 mg | ORAL_TABLET | Freq: Two times a day (BID) | ORAL | Status: DC
  Administered 2024-08-15 – 2024-08-16 (×2): 100 mg
  Filled 2024-08-15 (×2): qty 1

## 2024-08-15 MED ORDER — VITAL 1.5 CAL PO LIQD
1000.0000 mL | ORAL | Status: DC
  Administered 2024-08-15 – 2024-08-20 (×6): 1000 mL
  Filled 2024-08-15 (×8): qty 1000

## 2024-08-15 MED ORDER — CLONAZEPAM 1 MG PO TABS
1.0000 mg | ORAL_TABLET | Freq: Two times a day (BID) | ORAL | Status: DC
  Administered 2024-08-15 – 2024-08-21 (×14): 1 mg
  Filled 2024-08-15 (×13): qty 1

## 2024-08-15 MED ORDER — ONDANSETRON HCL 4 MG PO TABS: 4.0000 mg | ORAL_TABLET | Freq: Four times a day (QID) | ORAL | Status: DC | PRN

## 2024-08-15 MED ORDER — QUETIAPINE FUMARATE 50 MG PO TABS: 50.0000 mg | ORAL_TABLET | Freq: Two times a day (BID) | ORAL | Status: DC

## 2024-08-15 MED ORDER — QUETIAPINE FUMARATE 50 MG PO TABS
50.0000 mg | ORAL_TABLET | Freq: Two times a day (BID) | ORAL | Status: DC
  Administered 2024-08-15: 50 mg via ORAL
  Filled 2024-08-15: qty 1

## 2024-08-15 NOTE — Consult Note (Addendum)
 "  NAME:  Wayne Meyers, MRN:  [id number], DOB:  1990-09-14, LOS: 2 ADMISSION DATE:  08/13/2024, CONSULTATION DATE:  08/13/24 REFERRING MD:  Arthea , CHIEF COMPLAINT:  SOB   History of Present Illness:  34 yo M PMH CP, spastic quadriparesis, cortical blindness, anoxic brain injury, sz disorder, dysphagia G tube in situ who presented to [facility] ED 08/13/24 at referral of PCP for eval of SOB + hypoxia + Flu A positive.  CXR in ED w elevated R hemidiaphragm. CT chest with debris in airway, R mucus plugging. On levaquin , tamiflu . Due to worsening respiratory status patient intubated after admission.  Pertinent  Medical History  CP Spastic quadriplegia Anoxic brain injury  Cortical blindness  Sz disorder G tube in situ  Significant Hospital Events: Including procedures, antibiotic start and stop dates in addition to other pertinent events   08/13/24 admitted to [facility] with PCCM consult. Patient intubated on evaluation and switched to PCCM service 1/6: patient re-intubated after ET tube dislodged   Interim History / Subjective:  Patient sedated and on ventilator  Objective    Blood pressure (!) 94/52, pulse 82, temperature (!) 101.4 F (38.6 C), temperature source Axillary, resp. rate (!) 22, weight 63.3 kg, SpO2 100%.    Vent Mode: PRVC FiO2 (%):  [40 %-50 %] 40 % Set Rate:  [22 bmp] 22 bmp Vt Set:  [340 mL] 340 mL PEEP:  [5 cmH20] 5 cmH20 Plateau Pressure:  [27 cmH20-33 cmH20] 33 cmH20   Intake/Output Summary (Last 24 hours) at 08/15/2024 0901 Last data filed at 08/15/2024 0800 Gross per 24 hour  Intake 5084.06 ml  Output 700 ml  Net 4384.06 ml   Filed Weights   08/13/24 1044 08/15/24 0500  Weight: 62.1 kg 63.3 kg    Examination: General: chronically and acutely ill male, on ventilator HENT: Anicteric sclera pink mm poor dentition Lungs: scattered rhonchi, improving Cardiovascular: tachycardic  Abdomen: G tube  Extremities: no acute appearing joint deformity, chronic contractures   Neuro: Does not follow commands, sedated on ventilator  Resolved problem list   Assessment and Plan   Acute Hypoxic Respiratory Failure secondary to Influenza A and right lower lobe collapse/mucus plugging Sepsis secondary to above -Intubated with bronchoscopy 1/5; BAL cultures negative to date -On precedex  and fentanyl ; increased Klonopin  to 1mg  BID, add seroquel  50mg  BID -Chest PT; Hypertonic saline nebs BID, PRN albuterol   -Levaquin , Tamiflu  (1/5)  -On levophed  , wean as able  Hypoglycemia, resolved -On D5LR -Increase tube feeds to goal, 70ml/hr -Free water  q6h -Monitor  Urinary retention -Foley placed 1/6  Cerebral palsy Cognitive delay  Spastic quadriparesis  Cortical blindness G tube in situ  Hx anoxic brain injury Hx seizure disorder  - Tegretol  and depakene  home medications - Trickle tube feeds ordered     Review of Systems:   Unable to obtain 2/2 baseline cognitive delay , sedated/ventilated   Past Medical History:  He,  has a past medical history of Cerebral palsy (HCC), Esophageal reflux, Mental retardation, Seizure (HCC), Seizures (HCC), and Vision abnormalities.   Surgical History:   Past Surgical History:  Procedure Laterality Date   BUTTON CHANGE N/A 04/05/2014   Procedure: BUTTON CHANGE;  Surgeon: Lynwood LITTIE Celestia Mickey., MD;  [facility];  Service: Endoscopy;  Laterality: N/A;   BUTTON CHANGE N/A 01/09/2016   Procedure: BUTTON CHANGE;  Surgeon: Norleen Hint, MD;  [facility];  Service: Endoscopy;  Laterality: N/A;  may need sav dilators   BUTTON CHANGE N/A  04/14/2017   Procedure: BUTTON CHANGE;  Surgeon: Saintclair Jasper, MD;  Location: [facility];  Service: Gastroenterology;  Laterality: N/A;   BUTTON CHANGE N/A 10/04/2018   Procedure: BUTTON CHANGE;  Surgeon: Saintclair Jasper, MD;  [facility];  Service: Gastroenterology;  Laterality: N/A;   BUTTON CHANGE N/A 06/30/2021   Procedure: BUTTON CHANGE;  Surgeon: Saintclair Jasper,  MD;  [facility];  Service: Gastroenterology;  Laterality: N/A;   CIRCUMCISION  1990   HERNIA REPAIR N/A    Phreesia 11/26/2020   NISSEN FUNDOPLICATION  1986   PEG PLACEMENT  12/07/2011   Procedure: PERCUTANEOUS ENDOSCOPIC GASTROSTOMY (PEG) REPLACEMENT;  Surgeon: Lesta JULIANNA Fitz, MD;  [facility];  Service: Endoscopy;  Laterality: N/A;  20 french 4.4 cent.   PEG PLACEMENT N/A 01/04/2013   Procedure: PERCUTANEOUS ENDOSCOPIC GASTROSTOMY (PEG) REPLACEMENT;  Surgeon: Norleen JAYSON Hint, MD;  Location: [facility] ENDOSCOPY;  Service: Endoscopy;  Laterality: N/A;   PEG PLACEMENT N/A 03/19/2015   Procedure: PERCUTANEOUS ENDOSCOPIC GASTROSTOMY (PEG) REPLACEMENT;  Surgeon: Jerrell Sol, MD;  Location: [facility] ENDOSCOPY;  Service: Endoscopy;  Laterality: N/A;   PEG PLACEMENT N/A 09/21/2016   Procedure: PERCUTANEOUS ENDOSCOPIC GASTROSTOMY (PEG) REPLACEMENT;  Surgeon: Lynwood Bohr, MD;  [facility];  Service: Endoscopy;  Laterality: N/A;   PEG PLACEMENT N/A 02/13/2020   Procedure: PERCUTANEOUS ENDOSCOPIC GASTROSTOMY (PEG) REPLACEMENT;  Surgeon: Sol Jerrell, MD;  [facility];  Service: Endoscopy;  Laterality: N/A;   PEG PLACEMENT N/A 10/12/2022   Procedure: PERCUTANEOUS ENDOSCOPIC GASTROSTOMY (PEG) REPLACEMENT;  Surgeon: Saintclair Jasper, MD;  [facility];  Service: Gastroenterology;  Laterality: N/A;   PEG PLACEMENT  04/11/2024   Procedure: REPLACEMENT, PEG TUBE, WITHOUT ENDOSCOPY;  Surgeon: Sol Jerrell, MD;  [facility];  Service: Gastroenterology;;     Social History:   reports that he has never smoked. He has never used smokeless tobacco. He reports that he does not drink alcohol and does not use drugs.   Family History:  His family history includes Migraines in his mother; Other in his paternal grandfather; Seizures in his brother and mother.   Allergies Allergies[1]   Home Medications  Prior to Admission medications  Medication Sig Start Date  End Date Taking? Authorizing Provider  albuterol  (PROVENTIL ) (2.5 MG/3ML) 0.083% nebulizer solution Take 2.5 mg by nebulization every 6 (six) hours as needed for wheezing or shortness of breath.   Yes [provider]  carBAMazepine  (TEGRETOL ) 100 MG/5ML suspension TAKE 15 ML BY MOUTH 3 TIMES DAILY Patient taking differently: Place 300 mg into feeding tube in the morning, at noon, and at bedtime. 11/29/23  Yes Marianna City, NP  cetirizine (ZYRTEC) 10 MG tablet Place 10 mg into feeding tube See admin instructions. CRUSH 10 mg and administer 10 mg, PER TUBE, at bedtime   Yes [provider]  chlorpheniramine (CHLOR-TRIMETON) 4 MG tablet Place 2 mg into feeding tube See admin instructions. CRUSH 2 mg and administer, PER TUBE, three times a day   Yes [provider]  diphenhydrAMINE  HCl 50 MG/30ML LIQD Place 50 mg into feeding tube at bedtime.   Yes [provider]  glycopyrrolate  (ROBINUL ) 1 MG tablet Take 1 tablet (1 mg total) by mouth 3 (three) times daily. Patient taking differently: Place 1 mg into feeding tube in the morning, at noon, and at bedtime. 11/29/23  Yes Marianna City, NP  ibuprofen  (ADVIL ) 100 MG/5ML suspension Take 400 mg by mouth every 6 (six) hours as needed for mild pain (pain score 1-3) or  fever.   Yes [provider]  Melatonin 1 MG SUBL Place 3-5 mg into feeding tube at bedtime.   Yes [provider]  NON FORMULARY Place 4 oz into feeding tube See admin instructions. Activia yogurt, diluted - 4 ounces, per tube, at 6 PM   Yes [provider]  Nutritional Supplements (FEEDING SUPPLEMENT, JEVITY 1.5 CAL,) LIQD Place 237 mLs into feeding tube 4 (four) times daily.    [provider]  valproic  acid (DEPAKENE ) 250 MG/5ML solution GIVE 15 MLS BY G-TUBE 3 TIMES PER DAY Patient taking differently: Place 750 mg into feeding tube See admin instructions. 11/29/23   Marianna City, NP     Critical care time: 31  min    Keven Fila, PA-C  Pulmonary & Critical Care Medicine For pager details, please see AMION or use Epic chat  After 1900, please call [facility] for cross coverage needs 08/15/2024, 9:01 AM          [1]  Allergies Allergen Reactions   Augmentin [Amoxicillin-Pot Clavulanate] Diarrhea   Amoxicillin Other (See Comments)    Childhood allergy- no reaction noted   Latex Rash and Other (See Comments)    No powdered gloves!!!!   Powders [Talc] Rash and Other (See Comments)    From powdered gloves   "

## 2024-08-15 NOTE — Progress Notes (Signed)
 eLink Physician-Brief Progress Note Patient Name: Wayne Meyers DOB: May 16, 1991 MRN: [id number]   Date of Service  08/15/2024  HPI/Events of Note  Patient with oliguria and bladder scan positive for 523 ml of urine.  eICU Interventions  Foley catheter ordered.        Wayne Meyers 08/15/2024, 3:02 AM

## 2024-08-15 NOTE — Plan of Care (Signed)

## 2024-08-15 NOTE — Progress Notes (Signed)
 eLink Physician-Brief Progress Note Patient Name: Wayne Meyers DOB: 05-13-1991 MRN: [id number]   Date of Service  08/15/2024  HPI/Events of Note  Patient with recurrent hypoglycemia despite enteral nutrition.  eICU Interventions  D 5 % LR gtt started at 75 ml / hour.        Othniel Maret U Micaiah Remillard 08/15/2024, 12:09 AM

## 2024-08-15 NOTE — Progress Notes (Signed)
 38F straight cath foley latex free cath attempted with resistance met unable to advance foley. 16F coude cath latex foley cath attempted with resistance met at the same area. Unable to place foley at this time. Bedside RN made aware.   Zareah Hunzeker A 4th floor PCU/Urology

## 2024-08-15 NOTE — TOC Initial Note (Signed)
 Transition of Care [facility]) - Initial/Assessment Note    Patient Details  Name: Wayne Meyers MRN: [id number] Date of Birth: 1991-05-28  [facility] CM/SW Contact:    Jon ONEIDA Anon, RN Phone Number: 08/15/2024, 3:46 PM  Clinical Narrative:                 Pt is from home. Pt brought in from PCP via ambulance with complaints of decreased oxygenation and Flu A positive. Pt has medical hx of cerebral palsy, cortical blindness, and quadriplegia. Pt intubated on mechanical ventilation. Pt needing continued medical workup, not medically ready for discharge. ICM will follow for DC planning needs.  Expected Discharge Plan:  (TBD) Barriers to Discharge: Continued Medical Work up   Patient Goals and CMS Choice Patient states their goals for this hospitalization and ongoing recovery are:: Home CMS Medicare.gov Compare Post Acute Care list provided to:: Other (Comment Required) (NA) Choice offered to / list presented to : NA [place] ownership interest in [facility].provided to:: Parent NA    Expected Discharge Plan and Services In-house Referral: NA Discharge Planning Services: CM Consult Post Acute Care Choice: Durable Medical Equipment Living arrangements for the past 2 months: Single Family Home                 DME Arranged: N/A DME Agency: NA                  Prior Living Arrangements/Services Living arrangements for the past 2 months: Single Family Home Lives with:: Relatives Patient language and need for interpreter reviewed:: Yes Do you feel safe going back to the place where you live?:  (Pt sedated on the ventilator)      Need for Family Participation in Patient Care: Yes (Comment) Care giver support system in place?: Yes (comment) Current home services: DME Criminal Activity/Legal Involvement Pertinent to Current Situation/Hospitalization: No - Comment as needed  Activities of Daily Living      Permission Sought/Granted Permission  sought to share information with : Family Supports    Share Information with NAME: Lissandro, Dilorenzo  Father, Emergency Contact  [phone]           Emotional Assessment Appearance:: Other (Comment Required (UTA) Attitude/Demeanor/Rapport: Unable to Assess Affect (typically observed): Unable to Assess Orientation: :  (Pt on the ventilator) Alcohol / Substance Use: Not Applicable Psych Involvement: No (comment)  Admission diagnosis:  Acute respiratory failure with hypoxia (HCC) [J96.01] Patient Active Problem List   Diagnosis Date Noted   Mucus plugging of bronchi 08/14/2024   Cerebral palsy (HCC) 08/14/2024   Acute respiratory failure with hypoxia (HCC) 08/13/2024   Influenza A 08/13/2024   Atelectasis 08/13/2024   Insomnia 11/27/2020   Constipation 11/27/2020   Drooling 11/27/2020   S/P percutaneous endoscopic gastrostomy (PEG) tube placement (HCC) 05/18/2020   CP (cerebral palsy), congenital (HCC) 05/18/2016   Cerebral palsy, quadriplegic (HCC) 05/18/2016   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 [place] [address] Phone: [phone] Fax: [phone]     Social Drivers of Health (SDOH) Social History: SDOH Screenings   Food Insecurity:  No Food Insecurity (08/13/2024)  Housing: Low Risk (08/13/2024)  Transportation Needs: No Transportation Needs (08/13/2024)  Utilities: Not At Risk (08/13/2024)  Tobacco Use: Low Risk (08/13/2024)   SDOH Interventions:     Readmission Risk Interventions    08/15/2024    3:41 PM  Readmission Risk  Prevention Plan  Post Dischage Appt Complete  Medication Screening Complete  Transportation Screening Complete

## 2024-08-15 NOTE — Procedures (Signed)
" ° °  Procedure: insert indwelling catheter bedside   Urology Procedure Note:  Patient was ventilated and sedated on arrival.  Following multiple attempts for silicone catheter placement, urology was consulted for difficult Foley placement.   Patient was ventilated and sedated on my arrival.  He was prepped and draped in the usual sterile fashion.  Mild penile edema was noted.  10 mL of sterile lubricant was injected directly into the urethra.  Following this a 55f silicone catheter was advanced, meeting resistance at the level of the prostate.  After repositioning, I was able to advance the catheter into the bladder with immediate return of clear concentrated yellow urine.  Balloon was inflated with 10 mL of sterile water .  Catheter was connected to gravity drainage without dependent loops.  StatLock retention device was utilized on the upper right thigh.  This concluded the procedure.  Ole Bourdon, NP [facility] Pager: [phone]  "

## 2024-08-16 ENCOUNTER — Inpatient Hospital Stay (HOSPITAL_COMMUNITY): Payer: MEDICAID

## 2024-08-16 DIAGNOSIS — A419 Sepsis, unspecified organism: Secondary | ICD-10-CM | POA: Diagnosis not present

## 2024-08-16 DIAGNOSIS — J9811 Atelectasis: Secondary | ICD-10-CM | POA: Diagnosis not present

## 2024-08-16 DIAGNOSIS — J9601 Acute respiratory failure with hypoxia: Secondary | ICD-10-CM | POA: Diagnosis not present

## 2024-08-16 DIAGNOSIS — J09X1 Influenza due to identified novel influenza A virus with pneumonia: Secondary | ICD-10-CM | POA: Diagnosis not present

## 2024-08-16 LAB — CBC WITH DIFFERENTIAL/PLATELET
Abs Immature Granulocytes: 0.05 K/uL (ref 0.00–0.07)
Basophils Absolute: 0 K/uL (ref 0.0–0.1)
Basophils Relative: 0 %
Eosinophils Absolute: 0 K/uL (ref 0.0–0.5)
Eosinophils Relative: 0 %
HCT: 31.1 % — ABNORMAL LOW (ref 39.0–52.0)
Hemoglobin: 10.3 g/dL — ABNORMAL LOW (ref 13.0–17.0)
Immature Granulocytes: 1 %
Lymphocytes Relative: 28 %
Lymphs Abs: 3 K/uL (ref 0.7–4.0)
MCH: 30.2 pg (ref 26.0–34.0)
MCHC: 33.1 g/dL (ref 30.0–36.0)
MCV: 91.2 fL (ref 80.0–100.0)
Monocytes Absolute: 2.1 K/uL — ABNORMAL HIGH (ref 0.1–1.0)
Monocytes Relative: 20 %
Neutro Abs: 5.6 K/uL (ref 1.7–7.7)
Neutrophils Relative %: 51 %
Platelets: 106 K/uL — ABNORMAL LOW (ref 150–400)
RBC: 3.41 MIL/uL — ABNORMAL LOW (ref 4.22–5.81)
RDW: 14 % (ref 11.5–15.5)
WBC: 10.8 K/uL — ABNORMAL HIGH (ref 4.0–10.5)
nRBC: 0 % (ref 0.0–0.2)

## 2024-08-16 LAB — BASIC METABOLIC PANEL WITH GFR
Anion gap: 8 (ref 5–15)
BUN: 9 mg/dL (ref 6–20)
CO2: 24 mmol/L (ref 22–32)
Calcium: 8.2 mg/dL — ABNORMAL LOW (ref 8.9–10.3)
Chloride: 97 mmol/L — ABNORMAL LOW (ref 98–111)
Creatinine, Ser: 0.35 mg/dL — ABNORMAL LOW (ref 0.61–1.24)
GFR, Estimated: >60 mL/min
Glucose, Bld: 131 mg/dL — ABNORMAL HIGH (ref 70–99)
Potassium: 4.3 mmol/L (ref 3.5–5.1)
Sodium: 129 mmol/L — ABNORMAL LOW (ref 135–145)

## 2024-08-16 LAB — GLUCOSE, CAPILLARY
Glucose-Capillary: 134 mg/dL — ABNORMAL HIGH (ref 70–99)
Glucose-Capillary: 108 mg/dL — ABNORMAL HIGH (ref 70–99)
Glucose-Capillary: 122 mg/dL — ABNORMAL HIGH (ref 70–99)
Glucose-Capillary: 147 mg/dL — ABNORMAL HIGH (ref 70–99)
Glucose-Capillary: 127 mg/dL — ABNORMAL HIGH (ref 70–99)
Glucose-Capillary: 130 mg/dL — ABNORMAL HIGH (ref 70–99)

## 2024-08-16 LAB — CULTURE, BAL-QUANTITATIVE W GRAM STAIN: Culture: NO GROWTH

## 2024-08-16 LAB — MAGNESIUM: Magnesium: 2.2 mg/dL (ref 1.7–2.4)

## 2024-08-16 LAB — PHOSPHORUS: Phosphorus: 2.8 mg/dL (ref 2.5–4.6)

## 2024-08-16 MED ORDER — QUETIAPINE FUMARATE 100 MG PO TABS
200.0000 mg | ORAL_TABLET | Freq: Two times a day (BID) | ORAL | Status: DC
  Administered 2024-08-16: 200 mg
  Administered 2024-08-16: 100 mg
  Administered 2024-08-17 – 2024-08-21 (×10): 200 mg
  Filled 2024-08-16 (×12): qty 2

## 2024-08-16 MED ORDER — GLYCOPYRROLATE 1 MG PO TABS
1.0000 mg | ORAL_TABLET | Freq: Two times a day (BID) | ORAL | Status: DC
  Administered 2024-08-16 – 2024-08-21 (×11): 1 mg
  Filled 2024-08-16 (×11): qty 1

## 2024-08-16 MED ORDER — MIDAZOLAM HCL (PF) 2 MG/2ML IJ SOLN
2.0000 mg | Freq: Once | INTRAMUSCULAR | Status: AC
  Administered 2024-08-16: 2 mg via INTRAVENOUS

## 2024-08-16 MED ORDER — FUROSEMIDE 10 MG/ML IJ SOLN
20.0000 mg | Freq: Once | INTRAMUSCULAR | Status: AC
  Administered 2024-08-16: 20 mg via INTRAVENOUS
  Filled 2024-08-16: qty 2

## 2024-08-16 MED ORDER — DIPHENHYDRAMINE HCL 12.5 MG/5ML PO ELIX
12.5000 mg | ORAL_SOLUTION | Freq: Two times a day (BID) | ORAL | Status: DC
  Administered 2024-08-16 – 2024-08-21 (×10): 12.5 mg
  Filled 2024-08-16 (×10): qty 5

## 2024-08-16 NOTE — Progress Notes (Signed)
 Nutrition Follow-up  DOCUMENTATION CODES:   Not applicable  INTERVENTION:  - Continue current TF regimen: Vital 1.5 at 30 ml/h (720 ml per day) Provides 1080 kcal, 49 gm protein, 550 ml free water  daily   - FWF per CCM. FWF discontinued this AM.   - Will monitor for ability to transition back to patient's home tube feed formula/regimen.  NUTRITION DIAGNOSIS:   Inadequate oral intake related to inability to eat as evidenced by NPO status. *ongoing  GOAL:   Patient will meet greater than or equal to 90% of their needs *met with TF  MONITOR:   Vent status, Labs, Weight trends, TF tolerance  REASON FOR ASSESSMENT:   Consult Enteral/tube feeding initiation and management  ASSESSMENT:   34 yo M with PMH CP, spastic quadriparesis, cortical blindness, anoxic brain injury, sz disorder, dysphagia G tube in situ who presented for eval of SOB + hypoxia + Flu A positive.  1/6 Admit; Intubated; Trickle TF initiated 1/7 TF increased to goal   Patient is currently intubated on ventilator support MV: 7.1 L/min Temp (24hrs), Avg:99.5 F (37.5 C), Min:98.6 F (37 C), Max:101.3 F (38.5 C)  Patient advanced to goal of 66mL/hr yesterday, tolerating well with no issues.  No BM since admission, bowel regimen on board.   Will monitor weight trends and tolerance for need to adjust tube feed regimen.  Palliative care now consulted. GOC discussions ongoing.    Admit weight: 136# Current weight: 139# I&O's: +9.2L since admit   Medications reviewed and include: Colace BID, Miralax  daily Precedex  Fentanyl    Labs reviewed:  Na 129   Diet Order:   Diet Order             Diet NPO time specified  Diet effective now                   EDUCATION NEEDS:  Education needs have been addressed  Skin:  Skin Assessment: Reviewed RN Assessment  Last BM:  PTA  Height:  Ht Readings from Last 1 Encounters:  08/16/24 5' 2 (1.575 m)   Weight:  Wt Readings from Last 1  Encounters:  08/15/24 63.3 kg   BMI:  Body mass index is 25.52 kg/m.  Estimated Nutritional Needs:  Kcal:  1000-1200 kcals Protein:  50-60 grams Fluid:  >/= 1.5L    Trude Ned RD, LDN Contact via Secure Chat.

## 2024-08-16 NOTE — Progress Notes (Addendum)
" °  Bertram MARLA Merles, RN      -    Around [phone] this nurse entered this patient's room to empty this patient's foley bag. This patient immediately became agitated.  This nurse asked fellow nurse to get some versed  2mg  push for this patient's agitation. Patient began to then, bite down on their Endotracheal tube. Patient desatted down to 16. Which was viewed by this nurse on the monitor at bedside. Heart rate reached 154 around 0601 ,which can be reflected in the chart. Post giving versed  2mg  push, Patient then began to relax and let go of tube. At 0602-0603 this patient's O2 sats began to stablize. This nurse then did a neuro exam no new deficits. Checked CBG (108) and temp 99.5 axillary.  Elink/ Dr. Epimenio aware of patient's agitation. See new orders for this patient. (An additional 2mg  of versed  IV push was given. Handoff given to  dayshift RN, and report to CCMMD about above events.   Upon exit patient's VS Patient stable with O2 sats 98 HR116 BP 144/96 MAP 100   0738 AM 08/16/2024 Bertram MARLA Merles, RN  "

## 2024-08-16 NOTE — Progress Notes (Signed)
 Per CCM order with draw ETT 2 cm. Originally at 21 cm lip now resides at 19 cm lip. Receiving and returning full VT, Spo2 99%.

## 2024-08-16 NOTE — Progress Notes (Signed)
 "  NAME:  Wayne Meyers, MRN:  [id number], DOB:  1991-06-18, LOS: 3 ADMISSION DATE:  08/13/2024, CONSULTATION DATE:  08/13/24 REFERRING MD:  Arthea , CHIEF COMPLAINT:  SOB   History of Present Illness:  34 yo M PMH CP, spastic quadriparesis, cortical blindness, anoxic brain injury, sz disorder, dysphagia G tube in situ who presented to [facility] ED 08/13/24 at referral of PCP for eval of SOB + hypoxia + Flu A positive.  CXR in ED w elevated R hemidiaphragm. CT chest with debris in airway, R mucus plugging. On levaquin , tamiflu . Due to worsening respiratory status patient intubated after admission.  Pertinent  Medical History  CP Spastic quadriplegia Anoxic brain injury  Cortical blindness  Sz disorder G tube in situ  Significant Hospital Events: Including procedures, antibiotic start and stop dates in addition to other pertinent events   08/13/24 admitted to [facility] with PCCM consult. Patient intubated on evaluation and switched to PCCM service 1/6: patient re-intubated after ET tube dislodged  1/7 breakthrough agitation >> Seroquel  and clonazepam  added  Interim History / Subjective:   Critically ill, intubated, on Precedex  and fentanyl  drips. Second breakthrough agitation at 6 AM with biting of ET tube and severe desaturation, improved with Versed  push Febrile 101 this morning Urine output low at 550 cc Off pressors Copious oral secretions  Objective    Blood pressure (!) 134/92, pulse (!) 119, temperature (!) 101.3 F (38.5 C), temperature source Axillary, resp. rate (!) 22, weight 63.3 kg, SpO2 99%.    Vent Mode: PRVC FiO2 (%):  [40 %] 40 % Set Rate:  [22 bmp] 22 bmp Vt Set:  [340 mL] 340 mL PEEP:  [5 cmH20] 5 cmH20 Plateau Pressure:  [22 cmH20-37 cmH20] 22 cmH20   Intake/Output Summary (Last 24 hours) at 08/16/2024 0827 Last data filed at 08/16/2024 9257 Gross per 24 hour  Intake 3596.24 ml  Output 550 ml  Net 3046.24 ml   Filed Weights   08/13/24 1044 08/15/24 0500  Weight:  62.1 kg 63.3 kg    Examination: General: chronically and acutely ill male, on ventilator , oral secretions HENT: Anicteric sclera pink mm poor dentition, ETT at 20 cm at lip Lungs: scattered rhonchi, no accessory muscle use Cardiovascular: S1-S2 tacky Abdomen: G tube , soft, nontender Extremities: no acute appearing joint deformity, chronic contractures  Neuro: RASS -2 after Versed   Labs show mild hyponatremia, decreased leukocytosis, stable anemia Chest x-ray 1/7 bibasilar consolidation, ET tube in position  Resolved problem list   Assessment and Plan   Acute Hypoxic Respiratory Failure secondary to Influenza A and right lower lobe collapse/mucus plugging Septic shock secondary to above -Intubated with bronchoscopy 1/5; BAL cultures negative to date  - Continue Levaquin  and Tamiflu  Tracheobronchial toilet with chest PT, hypertonic saline nebs -Spontaneous breathing trials if we can control agitation - Resume home dose of Robinul  for copious oral secretions  Hypoglycemia, resolved - DC IV fluids -Increase tube feeds to goal   Mild hyponatremia -DC free water  10 L positive, Lasix  20 x 1  Urinary retention Latex allergy -Silicon Foley placed by urology 1/8  Acute agitated delirium Cerebral palsy Cognitive delay  Spastic quadriparesis  Cortical blindness Hx anoxic brain injury Hx seizure disorder  - Tegretol  and depakene  home medications - Precedex  and fentanyl  drips, goal RASS 0 to -1 - Titrating Seroquel  up to 200, continue clonazepam  1 twice daily - Resume home Benadryl  12.5 twice daily  Goals of care discussion with dad : He has home care ,  dad has seen a tracheostomy with his father who had dementia and did not do well.  He does not desire tracheostomy for Elhadj .  He he certainly does not want facility care.  He understands that and his agitation has been difficult to control and this is a mandatory step before we can proceed with spontaneous breathing trial.   Is still hopeful for successful extubation but has realistic expectations.  Will involve palliative care for further goals of care discussion  Critical care time: 40 min     Harden Staff MD. FCCP. [facility] Pager : [phone]  If no response to pager , please call [phone] until 7 pm After 7:00 pm call Elink  [phone]      08/16/2024, 8:27 AM        "

## 2024-08-16 NOTE — Progress Notes (Signed)
 Assisted with transferring PT to 1239 while on vent - uneventful. Also, placed bite block on ETT at approximately 1017.

## 2024-08-16 NOTE — Progress Notes (Addendum)
 eLink Physician-Brief Progress Note Patient Name: Wayne Meyers DOB: Feb 11, 1991 MRN: [id number]   Date of Service  08/16/2024  HPI/Events of Note  Patient is agitated, tachycardic, biting on the tube, and hypertensive. Chest auscultation with symmetrical breath sounds per bedside RN exam.  eICU Interventions  Versed  2 mg iv x 1 ordered and Precedex  gtt increased to 1.2 mcg.        [name] U [name] 08/16/2024, 6:51 AM

## 2024-08-17 ENCOUNTER — Inpatient Hospital Stay (HOSPITAL_COMMUNITY): Payer: MEDICAID

## 2024-08-17 DIAGNOSIS — G809 Cerebral palsy, unspecified: Secondary | ICD-10-CM | POA: Diagnosis not present

## 2024-08-17 DIAGNOSIS — R339 Retention of urine, unspecified: Secondary | ICD-10-CM

## 2024-08-17 DIAGNOSIS — J09X2 Influenza due to identified novel influenza A virus with other respiratory manifestations: Secondary | ICD-10-CM | POA: Diagnosis not present

## 2024-08-17 DIAGNOSIS — A419 Sepsis, unspecified organism: Secondary | ICD-10-CM | POA: Diagnosis not present

## 2024-08-17 DIAGNOSIS — R6521 Severe sepsis with septic shock: Secondary | ICD-10-CM | POA: Diagnosis not present

## 2024-08-17 DIAGNOSIS — E871 Hypo-osmolality and hyponatremia: Secondary | ICD-10-CM | POA: Diagnosis not present

## 2024-08-17 DIAGNOSIS — R451 Restlessness and agitation: Secondary | ICD-10-CM | POA: Diagnosis not present

## 2024-08-17 DIAGNOSIS — J9601 Acute respiratory failure with hypoxia: Secondary | ICD-10-CM | POA: Diagnosis not present

## 2024-08-17 LAB — BASIC METABOLIC PANEL WITH GFR
Anion gap: 8 (ref 5–15)
BUN: 8 mg/dL (ref 6–20)
CO2: 23 mmol/L (ref 22–32)
Calcium: 8.1 mg/dL — ABNORMAL LOW (ref 8.9–10.3)
Chloride: 94 mmol/L — ABNORMAL LOW (ref 98–111)
Creatinine, Ser: 0.3 mg/dL — ABNORMAL LOW (ref 0.61–1.24)
GFR, Estimated: >60 mL/min
Glucose, Bld: 140 mg/dL — ABNORMAL HIGH (ref 70–99)
Potassium: 4.8 mmol/L (ref 3.5–5.1)
Sodium: 125 mmol/L — ABNORMAL LOW (ref 135–145)

## 2024-08-17 LAB — CBC WITH DIFFERENTIAL/PLATELET
Abs Immature Granulocytes: 0.06 K/uL (ref 0.00–0.07)
Basophils Absolute: 0 K/uL (ref 0.0–0.1)
Basophils Relative: 0 %
Eosinophils Absolute: 0 K/uL (ref 0.0–0.5)
Eosinophils Relative: 0 %
HCT: 27.4 % — ABNORMAL LOW (ref 39.0–52.0)
Hemoglobin: 9.3 g/dL — ABNORMAL LOW (ref 13.0–17.0)
Immature Granulocytes: 1 %
Lymphocytes Relative: 14 %
Lymphs Abs: 1.8 K/uL (ref 0.7–4.0)
MCH: 30.1 pg (ref 26.0–34.0)
MCHC: 33.9 g/dL (ref 30.0–36.0)
MCV: 88.7 fL (ref 80.0–100.0)
Monocytes Absolute: 2.3 K/uL — ABNORMAL HIGH (ref 0.1–1.0)
Monocytes Relative: 18 %
Neutro Abs: 8.7 K/uL — ABNORMAL HIGH (ref 1.7–7.7)
Neutrophils Relative %: 67 %
Platelets: 121 K/uL — ABNORMAL LOW (ref 150–400)
RBC: 3.09 MIL/uL — ABNORMAL LOW (ref 4.22–5.81)
RDW: 14 % (ref 11.5–15.5)
WBC: 13 K/uL — ABNORMAL HIGH (ref 4.0–10.5)
nRBC: 0 % (ref 0.0–0.2)

## 2024-08-17 LAB — GLUCOSE, CAPILLARY
Glucose-Capillary: 115 mg/dL — ABNORMAL HIGH (ref 70–99)
Glucose-Capillary: 128 mg/dL — ABNORMAL HIGH (ref 70–99)
Glucose-Capillary: 134 mg/dL — ABNORMAL HIGH (ref 70–99)
Glucose-Capillary: 161 mg/dL — ABNORMAL HIGH (ref 70–99)
Glucose-Capillary: 162 mg/dL — ABNORMAL HIGH (ref 70–99)
Glucose-Capillary: 164 mg/dL — ABNORMAL HIGH (ref 70–99)

## 2024-08-17 LAB — MAGNESIUM: Magnesium: 2.2 mg/dL (ref 1.7–2.4)

## 2024-08-17 LAB — PHOSPHORUS: Phosphorus: 2.9 mg/dL (ref 2.5–4.6)

## 2024-08-17 MED ORDER — FUROSEMIDE 10 MG/ML IJ SOLN
40.0000 mg | Freq: Once | INTRAMUSCULAR | Status: AC
  Administered 2024-08-17: 40 mg via INTRAVENOUS
  Filled 2024-08-17: qty 4

## 2024-08-17 MED ORDER — LORATADINE 10 MG PO TABS
10.0000 mg | ORAL_TABLET | Freq: Every day | ORAL | Status: DC
  Administered 2024-08-17 – 2024-08-21 (×5): 10 mg
  Filled 2024-08-17 (×5): qty 1

## 2024-08-17 NOTE — Plan of Care (Signed)
  Problem: Pain Managment: Goal: General experience of comfort will improve and/or be controlled Outcome: Progressing   Problem: Safety: Goal: Ability to remain free from injury will improve Outcome: Progressing

## 2024-08-17 NOTE — Progress Notes (Signed)
 "  NAME:  Wayne Meyers, MRN:  [id number], DOB:  1990/08/27, LOS: 4 ADMISSION DATE:  08/13/2024, CONSULTATION DATE:  08/13/24 REFERRING MD:  Arthea , CHIEF COMPLAINT:  SOB   History of Present Illness:  34 yo M PMH CP, spastic quadriparesis, cortical blindness, anoxic brain injury, sz disorder, dysphagia G tube in situ who presented to [facility] ED 08/13/24 at referral of PCP for eval of SOB + hypoxia + Flu A positive.  CXR in ED w elevated R hemidiaphragm. CT chest with debris in airway, R mucus plugging. On levaquin , tamiflu . Due to worsening respiratory status patient intubated after admission.  Pertinent  Medical History  CP Spastic quadriplegia Anoxic brain injury  Cortical blindness  Sz disorder G tube in situ  Significant Hospital Events: Including procedures, antibiotic start and stop dates in addition to other pertinent events   08/13/24 admitted to [facility] with PCCM consult. Patient intubated on evaluation and switched to PCCM service 1/6: patient re-intubated after ET tube dislodged  1/7 breakthrough agitation >> Seroquel  and clonazepam  added  Interim History / Subjective:   On wake up assessment patient coughing and tongued ETT around teeth. Patient then had audible cuff leak. Sats stable. Required ETT to be readvanced and added back sedation. On prvc  Objective    Blood pressure 99/66, pulse 83, temperature (!) 97.4 F (36.3 C), temperature source Axillary, resp. rate (!) 22, height 5' 2 (1.575 m), weight 69 kg, SpO2 98%.    Vent Mode: PRVC FiO2 (%):  [30 %-35 %] 30 % Set Rate:  [22 bmp] 22 bmp Vt Set:  [340 mL] 340 mL PEEP:  [5 cmH20] 5 cmH20 Plateau Pressure:  [32 cmH20-37 cmH20] 33 cmH20   Intake/Output Summary (Last 24 hours) at 08/17/2024 0847 Last data filed at 08/17/2024 9366 Gross per 24 hour  Intake 2027.85 ml  Output 600 ml  Net 1427.85 ml   Filed Weights   08/13/24 1044 08/15/24 0500 08/17/24 0446  Weight: 62.1 kg 63.3 kg 69 kg    Examination: General:   chronically/critically ill appearing on mech vent HEENT: MM pink/moist; ETT in place Neuro: sedate CV: s1s2, RRR, no m/r/g PULM:  dim rhonchi BS bilaterally; [place]; on mech vent PRVC GI: soft, bsx4 active; g tube in place Extremities: warm/dry, chronic contractures   Resolved problem list   Assessment and Plan   Acute Hypoxic Respiratory Failure secondary to Influenza A and right lower lobe collapse/mucus plugging Septic shock secondary to above -Intubated with bronchoscopy 1/5; BAL cultures negative to date -reintubated after self extubation on 1/6 Plan: -ETT advanced this am; CXR showing good position -LTVV strategy with tidal volumes of 6-8 cc/kg ideal body weight -Wean PEEP/FiO2 for SpO2 >92% -VAP bundle in place -Daily SAT and SBT -PAD protocol in place -wean sedation for RASS goal 0 to -1 -trend cxr -pulm toiletry; cpt -currently on home robinul  for copious secretions; if patient's secretions become too thick or worried about mucous plugging would recommend dc'ing -cont levaquin  and tamiflu ; follow cultures  Hypoglycemia, resolved Plan: -cbg monitoring -cont TF  Hyponatremia -DC free water  10 L positive, Plan: -repeat dose of lasix  today; monitor uop -trend bmp  Urinary retention Latex allergy -Silicon Foley placed by urology 1/8 Plan: -monitor uop -lasix  today -cont foley  Acute agitated delirium Cerebral palsy Cognitive delay  Spastic quadriparesis  Cortical blindness Hx anoxic brain injury Hx seizure disorder  Plan: - cont home Tegretol  and depakene  - Precedex  and fentanyl  drips, goal RASS 0 to -1 -  cont Seroquel  200, continue clonazepam  1 bid - on home Benadryl  12.5 twice daily  Goals of care discussion with dad 1/8 : He has home care , dad has seen a tracheostomy with his father who had dementia and did not do well.  He does not desire tracheostomy for Mykal .  He he certainly does not want facility care.  He understands that and his agitation  has been difficult to control and this is a mandatory step before we can proceed with spontaneous breathing trial.  Is still hopeful for successful extubation but has realistic expectations.  Will involve palliative care for further goals of care discussion  On 1/9 updated father at bedside  Critical care time: 40 min    JD Emilio RIGGERS [facility] 08/17/2024, 9:06 AM  Please see [email or web address] for pager details.  From 7A-7P if no response, please call [phone]. After hours, please call ELink [phone].        "

## 2024-08-18 DIAGNOSIS — A4189 Other specified sepsis: Secondary | ICD-10-CM

## 2024-08-18 DIAGNOSIS — H47619 Cortical blindness, unspecified side of brain: Secondary | ICD-10-CM

## 2024-08-18 DIAGNOSIS — J9819 Other pulmonary collapse: Secondary | ICD-10-CM | POA: Diagnosis not present

## 2024-08-18 DIAGNOSIS — T17900A Unspecified foreign body in respiratory tract, part unspecified causing asphyxiation, initial encounter: Secondary | ICD-10-CM

## 2024-08-18 DIAGNOSIS — E871 Hypo-osmolality and hyponatremia: Secondary | ICD-10-CM | POA: Diagnosis not present

## 2024-08-18 DIAGNOSIS — T7849XA Other allergy, initial encounter: Secondary | ICD-10-CM | POA: Diagnosis not present

## 2024-08-18 DIAGNOSIS — J101 Influenza due to other identified influenza virus with other respiratory manifestations: Secondary | ICD-10-CM

## 2024-08-18 DIAGNOSIS — R6521 Severe sepsis with septic shock: Secondary | ICD-10-CM | POA: Diagnosis not present

## 2024-08-18 DIAGNOSIS — Z515 Encounter for palliative care: Secondary | ICD-10-CM

## 2024-08-18 DIAGNOSIS — R339 Retention of urine, unspecified: Secondary | ICD-10-CM | POA: Diagnosis not present

## 2024-08-18 DIAGNOSIS — Z7189 Other specified counseling: Secondary | ICD-10-CM

## 2024-08-18 DIAGNOSIS — J9601 Acute respiratory failure with hypoxia: Secondary | ICD-10-CM | POA: Diagnosis not present

## 2024-08-18 DIAGNOSIS — G800 Spastic quadriplegic cerebral palsy: Secondary | ICD-10-CM | POA: Diagnosis not present

## 2024-08-18 DIAGNOSIS — F05 Delirium due to known physiological condition: Secondary | ICD-10-CM | POA: Diagnosis not present

## 2024-08-18 LAB — BASIC METABOLIC PANEL WITH GFR
Anion gap: 10 (ref 5–15)
BUN: 9 mg/dL (ref 6–20)
CO2: 22 mmol/L (ref 22–32)
Calcium: 8.1 mg/dL — ABNORMAL LOW (ref 8.9–10.3)
Chloride: 93 mmol/L — ABNORMAL LOW (ref 98–111)
Creatinine, Ser: <0.3 mg/dL — ABNORMAL LOW (ref 0.61–1.24)
Glucose, Bld: 129 mg/dL — ABNORMAL HIGH (ref 70–99)
Potassium: 5.2 mmol/L — ABNORMAL HIGH (ref 3.5–5.1)
Sodium: 124 mmol/L — ABNORMAL LOW (ref 135–145)

## 2024-08-18 LAB — GLUCOSE, CAPILLARY
Glucose-Capillary: 115 mg/dL — ABNORMAL HIGH (ref 70–99)
Glucose-Capillary: 123 mg/dL — ABNORMAL HIGH (ref 70–99)
Glucose-Capillary: 129 mg/dL — ABNORMAL HIGH (ref 70–99)
Glucose-Capillary: 132 mg/dL — ABNORMAL HIGH (ref 70–99)
Glucose-Capillary: 133 mg/dL — ABNORMAL HIGH (ref 70–99)
Glucose-Capillary: 97 mg/dL (ref 70–99)

## 2024-08-18 LAB — CBC
HCT: 27.7 % — ABNORMAL LOW (ref 39.0–52.0)
Hemoglobin: 9.2 g/dL — ABNORMAL LOW (ref 13.0–17.0)
MCH: 30.1 pg (ref 26.0–34.0)
MCHC: 33.2 g/dL (ref 30.0–36.0)
MCV: 90.5 fL (ref 80.0–100.0)
Platelets: 137 K/uL — ABNORMAL LOW (ref 150–400)
RBC: 3.06 MIL/uL — ABNORMAL LOW (ref 4.22–5.81)
RDW: 14.2 % (ref 11.5–15.5)
WBC: 10 K/uL (ref 4.0–10.5)
nRBC: 0 % (ref 0.0–0.2)

## 2024-08-18 LAB — PHOSPHORUS: Phosphorus: 2.8 mg/dL (ref 2.5–4.6)

## 2024-08-18 LAB — CULTURE, BLOOD (ROUTINE X 2)
Culture: NO GROWTH
Special Requests: ADEQUATE

## 2024-08-18 LAB — MAGNESIUM: Magnesium: 2.5 mg/dL — ABNORMAL HIGH (ref 1.7–2.4)

## 2024-08-18 MED ORDER — FUROSEMIDE 10 MG/ML IJ SOLN
40.0000 mg | Freq: Two times a day (BID) | INTRAMUSCULAR | Status: DC
  Administered 2024-08-18 – 2024-08-22 (×10): 40 mg via INTRAVENOUS
  Filled 2024-08-18 (×10): qty 4

## 2024-08-18 NOTE — Consult Note (Signed)
 "                                                  Palliative Care Consult Note                                  Date: 08/18/2024   Patient Name: Wayne Meyers  DOB: Jul 12, 1991  MRN: [id number]  Age / Sex: 34 y.o., male  PCP: Nodal, Mickey Browner, PA-C Referring Physician: Mannam, Praveen, MD  Reason for Consultation: Establishing goals of care  HPI/Patient Profile: 34 y.o. male  with past medical history of CP, spastic quadriparesis, cortical blindness, anoxic brain injury, seizure disorder, and dysphagia w/ G tube admitted on 08/13/2024 at referral of PCP for evaluation of shortness of breath, hypoxia, and Flu A.  CXR in ED w elevated R hemidiaphragm. CT chest with debris in airway, R mucus plugging. On levaquin , tamiflu . Due to worsening respiratory status patient intubated after admission.   Past Medical History:  Diagnosis Date   Cerebral palsy (HCC)    Esophageal reflux    Mental retardation    Seizure (HCC)    Seizures (HCC)    Phreesia 11/26/2020   Vision abnormalities     Subjective:   I have reviewed medical records including critical care notes, labs and imaging, received an update from attending provider, assessed the patient, and then met with the patient's father Cathlyn (who is his legal guardian) to discuss diagnosis prognosis, GOC, EOL wishes, disposition and options.  I introduced Palliative Medicine as specialized medical care for people living with serious illness. It focuses on providing relief from symptoms and stress of a serious illness. The goal is to improve quality of life for both the patient and the family.  Today's Discussion: Patient lying in bed on ventilator. He appears comfortable and is in no apparent distress. Patient's father Cathlyn at bedside.  The patient's father has managed the patient's healthcare for his entire life. He has a good understanding of the patient's chronic conditions and current hospitalization. We discussed patient has been unable to  wean on vent. I share my worry including limitations of ongoing interventions and high risk for further decline despite aggressive treatment efforts.  Cathlyn tells me the patient has had fragile health since his birth. He has overcome many hurdles. The patient lives with Cathlyn and his wife. The patient has two younger brothers. The patient's mother is not involved in his life. Cathlyn is his primary caregiver but he also has caregivers seven days a week for around eight hours a day. The patient does not verbally communicate. He has a g-tube for feeds. His family and caregivers enjoy spending time with him.   Cathlyn is hopeful the patient will improve and be successfully extubated. We discuss options moving forward if he is unable to be extubated. We discussed the difference between an aggressive medical intervention path and a comfort focused path. Cathlyn is clear that he would not want the patient to have a tracheostomy or long term facility care. Greg's father had a very bad experience with trach and LTAC. We discussed what a compassionate extubation/comfort measures would look like. Discussed code status. Cathlyn would like to keep patient full code and full scope of treatment at this time. Cathlyn would like  time for outcomes but would like to have a family meeting so that the entire family can be on the same page. We will meet Monday 08/20/24 at 2 pm.   Discussed the importance of continued conversation with family and the medical providers regarding overall plan of care and treatment options, ensuring decisions are within the context of the patient's values and GOCs.  Emotional support and therapeutic listening provided. Questions and concerns were addressed. Hard Choices booklet left for review. The family was encouraged to call with questions or concerns. PMT will continue to support holistically.  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  Constitutional:      General: He is not in acute distress.    Appearance: He is ill-appearing.     Interventions: He is intubated.  Cardiovascular:     Rate and Rhythm: Normal rate.  Pulmonary:     Effort: He is intubated.  Musculoskeletal:     Comments: contractures  Skin:    General: Skin is dry.     Vital Signs:  BP 115/77 (BP Location: Left Arm)   Pulse 76   Temp 98.6 F (37 C) (Oral)   Resp (!) 22   Ht 5' 2 (1.575 m) Comment: per father  Wt 70 kg   SpO2 97%   BMI 28.23 kg/m   Palliative Assessment/Data: 30% with feeds    Advanced Care Planning:   Existing Vynca/ACP Documentation: None  Primary Decision Maker: LEGAL GUARDIAN: Patient's father Latavius Capizzi  Code Status/Advance Care Planning: Full code  Assessment & Plan:   SUMMARY OF RECOMMENDATIONS   Full code  Full scope Time for outcomes Dad would not want tracheostomy or LTC Family meeting Monday 08/20/24 at 2pm Continued PMT support   Discussed with: bedside RN and Dr. Theophilus  Time Total: 105 minutes   Thank you for allowing us  to participate in the care of KANNEN MOXEY PMT will continue to support holistically.   Signed by: Stephane Palin, NP Palliative Medicine Team  Team Phone # [phone] (Nights/Weekends)  08/18/2024, 12:13 PM   "

## 2024-08-18 NOTE — Progress Notes (Signed)
" °   08/18/24 1250  Daily Weaning Assessment  Daily Assessment of Readiness to Wean Wean protocol criteria not met  Reason not met (S)  Apnea (Changed to PSV 10/5, pt went apneic, placed back on full support.)    "

## 2024-08-18 NOTE — Progress Notes (Signed)
 "  NAME:  Wayne Meyers, MRN:  [id number], DOB:  05-Jul-1991, LOS: 5 ADMISSION DATE:  08/13/2024, CONSULTATION DATE:  08/13/24 REFERRING MD:  Arthea , CHIEF COMPLAINT:  SOB   History of Present Illness:  34 yo M PMH CP, spastic quadriparesis, cortical blindness, anoxic brain injury, sz disorder, dysphagia G tube in situ who presented to [facility] ED 08/13/24 at referral of PCP for eval of SOB + hypoxia + Flu A positive.  CXR in ED w elevated R hemidiaphragm. CT chest with debris in airway, R mucus plugging. On levaquin , tamiflu . Due to worsening respiratory status patient intubated after admission.  Pertinent  Medical History  CP Spastic quadriplegia Anoxic brain injury  Cortical blindness  Sz disorder G tube in situ  Significant Hospital Events: Including procedures, antibiotic start and stop dates in addition to other pertinent events   08/13/24 admitted to [facility] with PCCM consult. Patient intubated on evaluation and switched to PCCM service 1/6: patient re-intubated after ET tube dislodged  1/7 breakthrough agitation >> Seroquel  and clonazepam  added 1/9 Patient then had audible cuff leak. Sats stable. Required ETT to be readvanced and added back sedation.  Interim History / Subjective:   Remains on the ventilator.  Agitated whenever sedation is weaned  Objective    Blood pressure 110/65, pulse 78, temperature 98.6 F (37 C), temperature source Oral, resp. rate (!) 22, height 5' 2 (1.575 m), weight 70 kg, SpO2 100%.    Vent Mode: PRVC FiO2 (%):  [30 %] 30 % Set Rate:  [22 bmp] 22 bmp Vt Set:  [340 mL] 340 mL PEEP:  [5 cmH20] 5 cmH20 Plateau Pressure:  [31 cmH20-39 cmH20] 39 cmH20   Intake/Output Summary (Last 24 hours) at 08/18/2024 1118 Last data filed at 08/18/2024 1000 Gross per 24 hour  Intake 1765.22 ml  Output 1200 ml  Net 565.22 ml   Filed Weights   08/15/24 0500 08/17/24 0446 08/18/24 0500  Weight: 63.3 kg 69 kg 70 kg    Examination: Blood pressure 110/65, pulse 78,  temperature 98.6 F (37 C), temperature source Oral, resp. rate (!) 22, height 5' 2 (1.575 m), weight 70 kg, SpO2 100%. Chronically ill-appearing man, sedated on the ventilator Contracted extremities Lungs with coarse rhonchi Extremities with no edema  Labs/imaging reviewed Significant for sodium 124, potassium 5.2, BUN/creatinine 9/0.30 WBC 10.0, hemoglobin 9.2, platelets 137    Resolved problem list   Assessment and Plan   Acute Hypoxic Respiratory Failure secondary to Influenza A and right lower lobe collapse/mucus plugging Septic shock secondary to above -Intubated with bronchoscopy 1/5; BAL cultures negative to date -reintubated after self extubation on 1/6 Plan: Continue vent support Intermittent chest x-ray Wean as tolerated Pulmonary toilet Continue Levaquin .  Finish course of Tamiflu . -currently on home robinul  for copious secretions; if patient's secretions become too thick or worried about mucous plugging would recommend dc'ing  Hypoglycemia, resolved Plan: Continue monitor  Hyponatremia -off free water  11.3 L positive, Plan: Will give him dose of Lasix  and monitor response.  Urinary retention Latex allergy -Silicon Foley placed by urology 1/8 Plan: Continue Foley. Acute agitated delirium Cerebral palsy Cognitive delay  Spastic quadriparesis  Cortical blindness Hx anoxic brain injury Hx seizure disorder  Plan: - cont home Tegretol  and depakene  - Precedex  and fentanyl  drips, goal RASS 0 to -1 - cont Seroquel  200, continue clonazepam  1 bid - on home Benadryl  12.5 twice daily  Goals of care discussion with dad 1/8 : He has home care , dad has  seen a tracheostomy with his father who had dementia and did not do well.  He does not desire tracheostomy for [name] .  He he certainly does not want facility care.  He understands that and his agitation has been difficult to control and this is a mandatory step before we can proceed with spontaneous breathing  trial.  Is still hopeful for successful extubation but has realistic expectations.  Will involve palliative care for further goals of care discussion  On 1/10 updated father at bedside Palliative care is going to meet with family as well.  Critical care time:    The patient is critically ill with multiple organ system failure and requires high complexity decision making for assessment and support, frequent evaluation and titration of therapies, advanced monitoring, review of radiographic studies and interpretation of complex data.   Critical Care Time devoted to patient care services, exclusive of separately billable procedures, described in this note is 35 minutes.   Donnajean Chesnut MD [facility] See Amion for pager  If no response to pager , please call [phone] until 7pm After 7:00 pm call Elink  [phone] 08/18/2024, 11:31 AM       "

## 2024-08-18 NOTE — Progress Notes (Signed)
 eLink Physician-Brief Progress Note Patient Name: Wayne Meyers DOB: 1991/02/17 MRN: [id number]   Date of Service  08/18/2024  HPI/Events of Note  Change in secreations. Went from white/yellow to pink and frothy. Resp told the bedside that she is wprried about pulmonary edema. Already has ordered lasix  that is being given now.   eICU Interventions  No intervention     Intervention Category Intermediate Interventions: Respiratory distress - evaluation and management  Elvie Maines 08/18/2024, 11:39 PM

## 2024-08-19 DIAGNOSIS — Z515 Encounter for palliative care: Secondary | ICD-10-CM | POA: Diagnosis not present

## 2024-08-19 DIAGNOSIS — J9601 Acute respiratory failure with hypoxia: Secondary | ICD-10-CM | POA: Diagnosis not present

## 2024-08-19 DIAGNOSIS — A4189 Other specified sepsis: Secondary | ICD-10-CM | POA: Diagnosis not present

## 2024-08-19 DIAGNOSIS — T7849XA Other allergy, initial encounter: Secondary | ICD-10-CM | POA: Diagnosis not present

## 2024-08-19 DIAGNOSIS — R6521 Severe sepsis with septic shock: Secondary | ICD-10-CM | POA: Diagnosis not present

## 2024-08-19 LAB — BASIC METABOLIC PANEL WITH GFR
Anion gap: 7 (ref 5–15)
BUN: 9 mg/dL (ref 6–20)
CO2: 28 mmol/L (ref 22–32)
Calcium: 8.2 mg/dL — ABNORMAL LOW (ref 8.9–10.3)
Chloride: 91 mmol/L — ABNORMAL LOW (ref 98–111)
Creatinine, Ser: 0.32 mg/dL — ABNORMAL LOW (ref 0.61–1.24)
GFR, Estimated: >60 mL/min
Glucose, Bld: 115 mg/dL — ABNORMAL HIGH (ref 70–99)
Potassium: 4.5 mmol/L (ref 3.5–5.1)
Sodium: 126 mmol/L — ABNORMAL LOW (ref 135–145)

## 2024-08-19 LAB — GLUCOSE, CAPILLARY
Glucose-Capillary: 139 mg/dL — ABNORMAL HIGH (ref 70–99)
Glucose-Capillary: 99 mg/dL (ref 70–99)
Glucose-Capillary: 123 mg/dL — ABNORMAL HIGH (ref 70–99)
Glucose-Capillary: 127 mg/dL — ABNORMAL HIGH (ref 70–99)
Glucose-Capillary: 134 mg/dL — ABNORMAL HIGH (ref 70–99)
Glucose-Capillary: 114 mg/dL — ABNORMAL HIGH (ref 70–99)
Glucose-Capillary: 156 mg/dL — ABNORMAL HIGH (ref 70–99)

## 2024-08-19 LAB — CBC
HCT: 28.7 % — ABNORMAL LOW (ref 39.0–52.0)
Hemoglobin: 9.4 g/dL — ABNORMAL LOW (ref 13.0–17.0)
MCH: 29.5 pg (ref 26.0–34.0)
MCHC: 32.8 g/dL (ref 30.0–36.0)
MCV: 90 fL (ref 80.0–100.0)
Platelets: 173 K/uL (ref 150–400)
RBC: 3.19 MIL/uL — ABNORMAL LOW (ref 4.22–5.81)
RDW: 14 % (ref 11.5–15.5)
WBC: 9.8 K/uL (ref 4.0–10.5)
nRBC: 0 % (ref 0.0–0.2)

## 2024-08-19 LAB — CULTURE, BLOOD (ROUTINE X 2): Culture: NO GROWTH

## 2024-08-19 MED ORDER — SODIUM CHLORIDE 0.9% FLUSH: 10.0000 mL | INTRAVENOUS | Status: DC | PRN

## 2024-08-19 MED ORDER — SENNOSIDES 8.8 MG/5ML PO SYRP
10.0000 mL | ORAL_SOLUTION | Freq: Two times a day (BID) | ORAL | Status: DC
  Administered 2024-08-19 – 2024-08-21 (×5): 10 mL
  Filled 2024-08-19 (×8): qty 10

## 2024-08-19 NOTE — Progress Notes (Signed)
 "  NAME:  Wayne Meyers, MRN:  [id number], DOB:  09-10-1990, LOS: 6 ADMISSION DATE:  08/13/2024, CONSULTATION DATE:  08/13/24 REFERRING MD:  Arthea , CHIEF COMPLAINT:  SOB   History of Present Illness:  34 yo M PMH CP, spastic quadriparesis, cortical blindness, anoxic brain injury, sz disorder, dysphagia G tube in situ who presented to [facility] ED 08/13/24 at referral of PCP for eval of SOB + hypoxia + Flu A positive.  CXR in ED w elevated R hemidiaphragm. CT chest with debris in airway, R mucus plugging. On levaquin , tamiflu . Due to worsening respiratory status patient intubated after admission.  Pertinent  Medical History  CP Spastic quadriplegia Anoxic brain injury  Cortical blindness  Sz disorder G tube in situ  Significant Hospital Events: Including procedures, antibiotic start and stop dates in addition to other pertinent events   08/13/24 admitted to [facility] with PCCM consult. Patient intubated on evaluation and switched to PCCM service 1/6: patient re-intubated after ET tube dislodged  1/7 breakthrough agitation >> Seroquel  and clonazepam  added 1/9 Patient then had audible cuff leak. Sats stable. Required ETT to be readvanced and added back sedation.   Interim History / Subjective:   Remains on the ventilator.  Agitated overnight  Objective    Blood pressure (!) 157/81, pulse (!) 130, temperature 99.1 F (37.3 C), temperature source Axillary, resp. rate (!) 22, height 5' 2 (1.575 m), weight 71.6 kg, SpO2 98%.    Vent Mode: PRVC FiO2 (%):  [30 %] 30 % Set Rate:  [22 bmp] 22 bmp Vt Set:  [340 mL] 340 mL PEEP:  [5 cmH20] 5 cmH20 Plateau Pressure:  [28 cmH20-39 cmH20] 28 cmH20   Intake/Output Summary (Last 24 hours) at 08/19/2024 0740 Last data filed at 08/19/2024 0725 Gross per 24 hour  Intake 1933.97 ml  Output 1800 ml  Net 133.97 ml   Filed Weights   08/17/24 0446 08/18/24 0500 08/19/24 0500  Weight: 69 kg 70 kg 71.6 kg    Examination: Chronically ill-appearing  man Contracted extremities Sedated on vent, unresponsive Lungs with scattered rhonchi Extremities with no edema  Labs/imaging reviewed Significant for sodium 126, BUN/creatinine 9/0.32 Hemoglobin 9.4 No new imaging  Resolved problem list   Assessment and Plan   Acute Hypoxic Respiratory Failure secondary to Influenza A and right lower lobe collapse/mucus plugging Septic shock secondary to above -Intubated with bronchoscopy 1/5; BAL cultures negative to date -reintubated after self extubation on 1/6 Plan: Continue vent support Intermittent chest x-ray Unable to tolerate PSV weans Pulmonary toilet Continue Levaquin .  Finished course of Tamiflu . Currently on home robinul  for copious secretions; if patient's secretions become too thick or worried about mucous plugging would recommend dc'ing  Hypoglycemia, resolved Plan: Continue monitor  Hyponatremia -off free water  11.3 L positive, Plan: Continue with Lasix   Urinary retention Latex allergy -Silicon Foley placed by urology 1/8 Plan: Continue Foley. Acute agitated delirium Cerebral palsy Cognitive delay  Spastic quadriparesis  Cortical blindness Hx anoxic brain injury Hx seizure disorder  Plan: - cont home Tegretol  and depakene  - Precedex  and fentanyl  drips, goal RASS 0 to -1 - cont Seroquel  200, continue clonazepam  1 bid - on home Benadryl  12.5 twice daily  Goals of care discussion with dad 1/8 : He has home care , dad has seen a tracheostomy with his father who had dementia and did not do well.  He does not desire tracheostomy for Wayne Meyers .  He he certainly does not want facility care.  He understands that  and his agitation has been difficult to control and this is a mandatory step before we can proceed with spontaneous breathing trial.  Is still hopeful for successful extubation but has realistic expectations.   On 1/11 updated father at bedside Palliative care is on board.  Family meeting planned for  1/12  Critical care time:    The patient is critically ill with multiple organ system failure and requires high complexity decision making for assessment and support, frequent evaluation and titration of therapies, advanced monitoring, review of radiographic studies and interpretation of complex data.   Critical Care Time devoted to patient care services, exclusive of separately billable procedures, described in this note is 35 minutes.   Wayne Mizrahi MD [facility] See Amion for pager  If no response to pager , please call [phone] until 7pm After 7:00 pm call Elink  [phone] 08/19/2024, 7:44 AM      "

## 2024-08-19 NOTE — Progress Notes (Signed)
 "                                                                                                                                                                                                          Daily Progress Note   Patient Name: Wayne Meyers       Date: 08/19/2024 DOB: [birth year]  Age: 34 y.o. MRN#: [id number] Attending Physician: Mannam, Praveen, MD Primary Care Physician: Hunter Mickey Browner, PA-C Admit Date: 08/13/2024  Reason for Consultation/Follow-up: Establishing goals of care   Length of Stay: 6  Current Medications: Scheduled Meds:   carBAMazepine   300 mg Per Tube TID   Chlorhexidine  Gluconate Cloth  6 each Topical Daily   clonazePAM   1 mg Per Tube BID   diphenhydrAMINE   12.5 mg Per Tube BID   docusate  100 mg Per Tube BID   enoxaparin  (LOVENOX ) injection  30 mg Subcutaneous QHS   feeding supplement (VITAL 1.5 CAL)  1,000 mL Per Tube Q24H   fentaNYL  (SUBLIMAZE ) injection  25-50 mcg Intravenous Once   furosemide   40 mg Intravenous Q12H   glycopyrrolate   1 mg Per Tube BID   loratadine   10 mg Per Tube Daily   mouth rinse  15 mL Mouth Rinse Q2H   pantoprazole  (PROTONIX ) IV  40 mg Intravenous Daily   polyethylene glycol  17 g Per Tube Daily   QUEtiapine   200 mg Per Tube BID   valproic  acid  750 mg Per Tube TID    Continuous Infusions:  dexmedetomidine  (PRECEDEX ) IV infusion 0.7 mcg/kg/hr (08/19/24 1007)   fentaNYL  infusion INTRAVENOUS 200 mcg/hr (08/19/24 0735)   levofloxacin  (LEVAQUIN ) IV Stopped (08/18/24 1619)   norepinephrine  (LEVOPHED ) Adult infusion Stopped (08/18/24 0802)    PRN Meds: acetaminophen  **OR** acetaminophen , albuterol , fentaNYL , midazolam  PF, ondansetron  **OR** ondansetron  (ZOFRAN ) IV, mouth rinse, sodium chloride  flush, sorbitol   Physical Exam Vitals reviewed.  Constitutional:      General: He is not in acute distress.    Appearance: He is ill-appearing.     Interventions: He is intubated.  Cardiovascular:     Rate and Rhythm:  Tachycardia present.  Pulmonary:     Effort: He is intubated.  Skin:    General: Skin is warm and dry.             Vital Signs: BP 132/78   Pulse (!) 116   Temp 99.1 F (37.3 C) (Axillary)   Resp (!) 22   Ht 5' 2 (1.575 m) Comment: per father  Wt 71.6 kg   SpO2 98%  BMI 28.87 kg/m  SpO2: SpO2: 98 % O2 Device: O2 Device: Ventilator O2 Flow Rate: O2 Flow Rate (L/min): 2 L/min       Palliative Assessment/Data: 30%      Patient Active Problem List   Diagnosis Date Noted   Mucus plugging of bronchi 08/14/2024   Cerebral palsy (HCC) 08/14/2024   Acute respiratory failure with hypoxia (HCC) 08/13/2024   Influenza A 08/13/2024   Atelectasis 08/13/2024   Insomnia 11/27/2020   Constipation 11/27/2020   Drooling 11/27/2020   S/P percutaneous endoscopic gastrostomy (PEG) tube placement (HCC) 05/18/2020   CP (cerebral palsy), congenital (HCC) 05/18/2016   Cerebral palsy, quadriplegic (HCC) 05/18/2016   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 male  with past medical history of CP, spastic quadriparesis, cortical blindness, anoxic brain injury, seizure disorder, and dysphagia w/ G tube admitted on 08/13/2024 at referral of PCP for evaluation of shortness of breath, hypoxia, and Flu A.   CXR in ED w elevated R hemidiaphragm. CT chest with debris in airway, R mucus plugging. On levaquin , tamiflu . Due to worsening respiratory status patient intubated after admission.   Today's Discussion: Reviewed chart and received update from nursing. Patient is still requiring sedation for agitation. He remains unable to wean  from vent. Patient appears comfortable and in no apparent distress. No family at bedside.   Left voicemail with callback information for patient's father.   Plan to have family meeting tomorrow Monday 08/20/24 at 2pm.  Recommendations/Plan: Full code Full scope Time for outcomes Dad would not want tracheostomy or LTC Family meeting Monday 08/20/24 at 2pm Continued PMT support    Code Status:    Code Status Orders  (From admission, onward)           Start     Ordered   08/13/24 1551  Full code  Continuous       Question:  By:  Answer:  Consent: discussion documented in EHR   08/13/24 1553          Care plan was discussed with bedside RN  Time spent: 25 minutes  Thank you for allowing the Palliative Medicine Team to assist in the care of this patient.    Stephane CHRISTELLA Palin, NP  Please contact Palliative Medicine Team phone at [phone] for questions and concerns.       "

## 2024-08-19 NOTE — Plan of Care (Signed)
" °  Problem: Education: Goal: Knowledge of General Education information will improve Description: Including pain rating scale, medication(s)/side effects and non-pharmacologic comfort measures Outcome: Not Progressing   Problem: Health Behavior/Discharge Planning: Goal: Ability to manage health-related needs will improve Outcome: Not Progressing   Problem: Clinical Measurements: Goal: Ability to maintain clinical measurements within normal limits will improve Outcome: Not Progressing   Problem: Activity: Goal: Risk for activity intolerance will decrease Outcome: Not Progressing   Problem: Elimination: Goal: Will not experience complications related to bowel motility Outcome: Not Progressing   Problem: Pain Managment: Goal: General experience of comfort will improve and/or be controlled Outcome: Not Progressing   Problem: Activity: Goal: Ability to tolerate increased activity will improve Outcome: Not Progressing   Problem: Respiratory: Goal: Ability to maintain a clear airway and adequate ventilation will improve Outcome: Not Progressing   "

## 2024-08-19 NOTE — Progress Notes (Signed)
 New pink frothy sputum noted, coarse crackle auscultated bilaterally . Elink notified, no new orders given. Scheduled lasix  given.

## 2024-08-20 ENCOUNTER — Inpatient Hospital Stay (HOSPITAL_COMMUNITY): Payer: MEDICAID

## 2024-08-20 DIAGNOSIS — R451 Restlessness and agitation: Secondary | ICD-10-CM | POA: Diagnosis not present

## 2024-08-20 DIAGNOSIS — J101 Influenza due to other identified influenza virus with other respiratory manifestations: Secondary | ICD-10-CM | POA: Diagnosis not present

## 2024-08-20 DIAGNOSIS — R6521 Severe sepsis with septic shock: Secondary | ICD-10-CM | POA: Diagnosis not present

## 2024-08-20 DIAGNOSIS — J9819 Other pulmonary collapse: Secondary | ICD-10-CM | POA: Diagnosis not present

## 2024-08-20 DIAGNOSIS — H47619 Cortical blindness, unspecified side of brain: Secondary | ICD-10-CM | POA: Diagnosis not present

## 2024-08-20 DIAGNOSIS — Z7189 Other specified counseling: Secondary | ICD-10-CM | POA: Diagnosis not present

## 2024-08-20 DIAGNOSIS — G800 Spastic quadriplegic cerebral palsy: Secondary | ICD-10-CM | POA: Diagnosis not present

## 2024-08-20 DIAGNOSIS — J9601 Acute respiratory failure with hypoxia: Secondary | ICD-10-CM | POA: Diagnosis not present

## 2024-08-20 DIAGNOSIS — F05 Delirium due to known physiological condition: Secondary | ICD-10-CM | POA: Diagnosis not present

## 2024-08-20 DIAGNOSIS — Z515 Encounter for palliative care: Secondary | ICD-10-CM | POA: Diagnosis not present

## 2024-08-20 DIAGNOSIS — A4189 Other specified sepsis: Secondary | ICD-10-CM | POA: Diagnosis not present

## 2024-08-20 DIAGNOSIS — E871 Hypo-osmolality and hyponatremia: Secondary | ICD-10-CM | POA: Diagnosis not present

## 2024-08-20 DIAGNOSIS — Z8669 Personal history of other diseases of the nervous system and sense organs: Secondary | ICD-10-CM | POA: Diagnosis not present

## 2024-08-20 DIAGNOSIS — R339 Retention of urine, unspecified: Secondary | ICD-10-CM | POA: Diagnosis not present

## 2024-08-20 LAB — BASIC METABOLIC PANEL WITH GFR
Anion gap: 10 (ref 5–15)
BUN: 8 mg/dL (ref 6–20)
CO2: 28 mmol/L (ref 22–32)
Calcium: 8.1 mg/dL — ABNORMAL LOW (ref 8.9–10.3)
Chloride: 90 mmol/L — ABNORMAL LOW (ref 98–111)
Creatinine, Ser: <0.3 mg/dL — ABNORMAL LOW (ref 0.61–1.24)
Glucose, Bld: 131 mg/dL — ABNORMAL HIGH (ref 70–99)
Potassium: 3.7 mmol/L (ref 3.5–5.1)
Sodium: 129 mmol/L — ABNORMAL LOW (ref 135–145)

## 2024-08-20 LAB — CBC
HCT: 25.4 % — ABNORMAL LOW (ref 39.0–52.0)
Hemoglobin: 8.7 g/dL — ABNORMAL LOW (ref 13.0–17.0)
MCH: 29.7 pg (ref 26.0–34.0)
MCHC: 34.3 g/dL (ref 30.0–36.0)
MCV: 86.7 fL (ref 80.0–100.0)
Platelets: 249 K/uL (ref 150–400)
RBC: 2.93 MIL/uL — ABNORMAL LOW (ref 4.22–5.81)
RDW: 13.7 % (ref 11.5–15.5)
WBC: 10.8 K/uL — ABNORMAL HIGH (ref 4.0–10.5)
nRBC: 0.4 % — ABNORMAL HIGH (ref 0.0–0.2)

## 2024-08-20 LAB — GLUCOSE, CAPILLARY
Glucose-Capillary: 93 mg/dL (ref 70–99)
Glucose-Capillary: 129 mg/dL — ABNORMAL HIGH (ref 70–99)
Glucose-Capillary: 105 mg/dL — ABNORMAL HIGH (ref 70–99)
Glucose-Capillary: 95 mg/dL (ref 70–99)
Glucose-Capillary: 132 mg/dL — ABNORMAL HIGH (ref 70–99)

## 2024-08-20 MED ORDER — MIDAZOLAM HCL 2 MG/2ML IJ SOLN
INTRAMUSCULAR | Status: AC
  Administered 2024-08-20: 4 mg via INTRAVENOUS
  Filled 2024-08-20: qty 4

## 2024-08-20 MED ORDER — PROPOFOL 1000 MG/100ML IV EMUL
0.0000 ug/kg/min | INTRAVENOUS | Status: DC
  Administered 2024-08-20: 5 ug/kg/min via INTRAVENOUS
  Administered 2024-08-20: 55 ug/kg/min via INTRAVENOUS
  Administered 2024-08-21: 65 ug/kg/min via INTRAVENOUS
  Administered 2024-08-21: 45 ug/kg/min via INTRAVENOUS
  Administered 2024-08-21: 35 ug/kg/min via INTRAVENOUS
  Administered 2024-08-21: 75 ug/kg/min via INTRAVENOUS
  Administered 2024-08-21: 45 ug/kg/min via INTRAVENOUS
  Administered 2024-08-22: 65 ug/kg/min via INTRAVENOUS
  Administered 2024-08-22: 70 ug/kg/min via INTRAVENOUS
  Administered 2024-08-22 (×2): 55 ug/kg/min via INTRAVENOUS
  Administered 2024-08-22: 50 ug/kg/min via INTRAVENOUS
  Administered 2024-08-22: 60 ug/kg/min via INTRAVENOUS
  Administered 2024-08-22: 55 ug/kg/min via INTRAVENOUS
  Administered 2024-08-23: 35 ug/kg/min via INTRAVENOUS
  Administered 2024-08-23: 65 ug/kg/min via INTRAVENOUS
  Administered 2024-08-23: 60 ug/kg/min via INTRAVENOUS
  Administered 2024-08-23: 70 ug/kg/min via INTRAVENOUS
  Filled 2024-08-20 (×18): qty 100

## 2024-08-20 MED ORDER — DEXMEDETOMIDINE HCL IN NACL 400 MCG/100ML IV SOLN
0.0000 ug/kg/h | INTRAVENOUS | Status: DC
  Administered 2024-08-20: 0.7 ug/kg/h via INTRAVENOUS
  Filled 2024-08-20: qty 100

## 2024-08-20 MED ORDER — MIDAZOLAM HCL (PF) 2 MG/2ML IJ SOLN: 4.0000 mg | Freq: Once | INTRAMUSCULAR | Status: AC

## 2024-08-20 NOTE — Progress Notes (Signed)
 "                                                                                                                                                                                                          Daily Progress Note   Patient Name: Wayne Meyers       Date: 08/20/2024 DOB: 1991/08/01  Age: 34 y.o. MRN#: [id number] Attending Physician: Mannam, Praveen, MD Primary Care Physician: Hunter Mickey Browner, PA-C Admit Date: 08/13/2024  Reason for Consultation/Follow-up: Establishing goals of care   Length of Stay: 7  Current Medications: Scheduled Meds:   carBAMazepine   300 mg Per Tube TID   Chlorhexidine  Gluconate Cloth  6 each Topical Daily   clonazePAM   1 mg Per Tube BID   diphenhydrAMINE   12.5 mg Per Tube BID   docusate  100 mg Per Tube BID   enoxaparin  (LOVENOX ) injection  30 mg Subcutaneous QHS   feeding supplement (VITAL 1.5 CAL)  1,000 mL Per Tube Q24H   fentaNYL  (SUBLIMAZE ) injection  25-50 mcg Intravenous Once   furosemide   40 mg Intravenous Q12H   glycopyrrolate   1 mg Per Tube BID   loratadine   10 mg Per Tube Daily   mouth rinse  15 mL Mouth Rinse Q2H   pantoprazole  (PROTONIX ) IV  40 mg Intravenous Daily   polyethylene glycol  17 g Per Tube Daily   QUEtiapine   200 mg Per Tube BID   sennosides  10 mL Per Tube BID   valproic  acid  750 mg Per Tube TID    Continuous Infusions:  dexmedetomidine  (PRECEDEX ) IV infusion     fentaNYL  infusion INTRAVENOUS 400 mcg/hr (08/20/24 1506)   norepinephrine  (LEVOPHED ) Adult infusion 2 mcg/min (08/20/24 1506)   propofol  (DIPRIVAN ) infusion 20 mcg/kg/min (08/20/24 1506)    PRN Meds: acetaminophen  **OR** acetaminophen , albuterol , fentaNYL , midazolam  PF, ondansetron  **OR** ondansetron  (ZOFRAN ) IV, mouth rinse, sodium chloride  flush, sorbitol   Physical Exam Vitals reviewed.  Constitutional:      General: He is not in acute distress.    Appearance: He is ill-appearing.     Interventions: He is intubated.  Cardiovascular:     Rate  and Rhythm: Normal rate.  Pulmonary:     Effort: He is intubated.  Skin:    General: Skin is dry.             Vital Signs: BP (!) 115/58   Pulse 89   Temp 99.3 F (37.4 C) (Axillary)   Resp (!) 22   Ht 5' 2 (1.575 m) Comment: per father  Wt 21.6  kg   SpO2 97%   BMI 28.87 kg/m  SpO2: SpO2: 97 % O2 Device: O2 Device: Ventilator O2 Flow Rate: O2 Flow Rate (L/min): 2 L/min       Palliative Assessment/Data: 30%      Patient Active Problem List   Diagnosis Date Noted   Mucus plugging of bronchi 08/14/2024   Cerebral palsy (HCC) 08/14/2024   Acute respiratory failure with hypoxia (HCC) 08/13/2024   Influenza A 08/13/2024   Atelectasis 08/13/2024   Insomnia 11/27/2020   Constipation 11/27/2020   Drooling 11/27/2020   S/P percutaneous endoscopic gastrostomy (PEG) tube placement (HCC) 05/18/2020   CP (cerebral palsy), congenital (HCC) 05/18/2016   Cerebral palsy, quadriplegic (HCC) 05/18/2016   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 male  with past medical history of CP, spastic quadriparesis, cortical blindness, anoxic brain injury, seizure disorder, and dysphagia w/ G tube admitted on 08/13/2024 at referral of PCP for evaluation of shortness of breath, hypoxia, and Flu A.   CXR in ED w elevated R hemidiaphragm. CT chest with debris in airway, R mucus plugging. On levaquin , tamiflu . Due to worsening respiratory status patient intubated after admission.   Today's Discussion: Reviewed chart and received update from nursing and attending provider. Patient is requiring increased sedation for agitation.  Family meeting with patient's father Cordella, step-mother Renee, CCM NP Benton Lesches, and PMT.   We discussed the patient's current hospitalization and discussed his continued agitation and intubation. Patient's father would not want the patient to have tracheostomy or LTC. Discussed options moving forward. We discuss what a compassionate extubation would look like. He would like to transition the patient to compassionate extubation later this week-- Wed or Thur. This will allow family time to visit. He would like to change the patient's code status to do not resuscitate.  Emotional support and therapeutic listening provided. Encouraged patient's father to call PMT with questions or needs. PMT will continue to support.   Recommendations/Plan: DNR Full scope Family will let team know timing of compassionate extubation this week- likely Wednesday or Thursday. Continued PMT support    Code Status:    Code Status Orders  (From admission, onward)           Start     Ordered   08/13/24 1551  Full code  Continuous       Question:  By:  Answer:  Consent: discussion documented in EHR   08/13/24 1553          Care plan was discussed with Benton Lesches NP and Dr. Theophilus  Time spent: 50 minutes  Thank you for allowing the Palliative Medicine Team to assist in the care of this patient.    Stephane CHRISTELLA Palin, NP  Please contact Palliative Medicine Team phone at [phone] for questions and concerns.       "

## 2024-08-20 NOTE — Progress Notes (Incomplete)
 Attending note: I have seen and examined the patient. History, labs and imaging reviewed.    Blood pressure 111/61, pulse 75, temperature 99.3 F (37.4 C), temperature source Axillary, resp. rate (!) 22, height 5' 2 (1.575 m), weight 71.6 kg, SpO2 98%. Gen:      No acute distress HEENT:  EOMI, sclera anicteric Neck:     No masses; no thyromegaly Lungs:    Clear to auscultation bilaterally; normal respiratory effort*** CV:         Regular rate and rhythm; no murmurs Abd:      + bowel sounds; soft, non-tender; no palpable masses, no distension Ext:    No edema; adequate peripheral perfusion Neuro: alert and oriented x 3 Psych: normal mood and affect   Labs/Imaging personally reviewed, significant for   Assessment/plan:   Family meeting today when we discussed all options for [place]. Decision made to transition to comfort measures later this week DNR  Continue vent support for now Propofol  for sedation    The patient is critically ill with multiple organ systems failure and requires high complexity decision making for assessment and support, frequent evaluation and titration of therapies, application of advanced monitoring technologies and extensive interpretation of multiple databases.  Critical care time - 35 mins. This represents my time independent of the NPs time taking care of the pt.  Patte Winkel MD [facility] 08/20/2024, 4:33 PM

## 2024-08-20 NOTE — IPAL (Signed)
" °  Interdisciplinary Goals of Care Family Meeting   Date carried out: 08/20/2024  Location of the meeting: Bedside  Member's involved: Nurse Practitioner, Family Member or next of kin, and Palliative care team member  Durable Power of Attorney or acting medical decision maker: Patient's father, Wayne Meyers  Discussion: Palliative care and myself met with Wayne Meyers this afternoon to discuss goals of care for Wayne Meyers.  Wayne Meyers understands the trajectory and expected outcome for Wayne Meyers and again reiterates he would not want to pursue tracheostomy tube given worsening quality of life.  Wayne Meyers states that he would like a few days to coordinate family for visitation prior to transitioning to a compassionate extubation either Wednesday or Thursday of this week.  He relayed in the meantime that he would like Wayne Meyers CODE STATUS changed to DNR  Code status:   Code Status: Do not attempt resuscitation (DNR) PRE-ARREST INTERVENTIONS DESIRED   Disposition: Continue current acute care with plans to transition to comfort care in the coming days  Time spent for the meeting: 35 mins   Merlinda Wrubel D. Harris, NP-C  Pulmonary & Critical Care Personal contact information can be found on Amion  If no contact or response made please call [phone] 08/20/2024, 3:08 PM     "

## 2024-08-20 NOTE — Progress Notes (Addendum)
 "  NAME:  Wayne Meyers, MRN:  [id number], DOB:  Jul 21, 1991, LOS: 7 ADMISSION DATE:  08/13/2024, CONSULTATION DATE:  08/13/24 REFERRING MD:  Wayne Meyers , CHIEF COMPLAINT:  SOB   History of Present Illness:  34 yo M PMH CP, spastic quadriparesis, cortical blindness, anoxic brain injury, sz disorder, dysphagia G tube in situ who presented to [facility] ED 08/13/24 at referral of PCP for eval of SOB + hypoxia + Flu A positive.  CXR in ED w elevated R hemidiaphragm. CT chest with debris in airway, R mucus plugging. On levaquin , tamiflu . Due to worsening respiratory status patient intubated after admission.  Pertinent  Medical History  CP Spastic quadriplegia Anoxic brain injury  Cortical blindness  Sz disorder G tube in situ  Significant Hospital Events: Including procedures, antibiotic start and stop dates in addition to other pertinent events   08/13/24 admitted to [facility] with PCCM consult. Patient intubated on evaluation and switched to PCCM service 1/6: patient re-intubated after ET tube dislodged  1/7 breakthrough agitation >> Seroquel  and clonazepam  added 1/9 Patient then had audible cuff leak. Sats stable. Required ETT to be readvanced and added back sedation. 1/12 ongoing issues with agitation despite high-dose and Precedex  drips.  Patient is thrashing head back-and-forth resulting in dislodgment of ET tube  Interim History / Subjective:  Intermittent episodes of agitation on ventilator  Goals of care discussion with family at 2 PM  Objective    Blood pressure (!) 146/88, pulse (!) 121, temperature 99.3 F (37.4 C), temperature source Axillary, resp. rate (!) 26, height 5' 2 (1.575 m), weight 71.6 kg, SpO2 94%.    Vent Mode: PRVC FiO2 (%):  [30 %] 30 % Set Rate:  [22 bmp] 22 bmp Vt Set:  [340 mL] 340 mL PEEP:  [5 cmH20] 5 cmH20 Plateau Pressure:  [28 cmH20-37 cmH20] 28 cmH20   Intake/Output Summary (Last 24 hours) at 08/20/2024 1144 Last data filed at 08/20/2024 1144 Gross per 24 hour   Intake 2456.84 ml  Output 2175 ml  Net 281.84 ml   Filed Weights   08/18/24 0500 08/19/24 0500 08/20/24 0500  Weight: 70 kg 71.6 kg 71.6 kg    Examination: General: Acute on chronic deconditioned adult male with history of cerebral palsy lying in bed on mechanical ventilation in no acute distress HEENT: ETT, MM pink/moist, PERRL,  Neuro: Waxing and waning mentation fluctuate between agitated and sedated on ventilator CV: s1s2 regular rate and rhythm, no murmur, rubs, or gallops,  PULM: Clear to auscultations bilaterally, no increased work of breathing, no added breath sounds GI: soft, bowel sounds active in all 4 quadrants, non-tender, non-distended, tolerating TF Extremities: warm/dry, no edema  Skin: no rashes or lesions   Resolved problem list  Hypoglycemia  Assessment and Plan   Acute Hypoxic Respiratory Failure secondary to Influenza A and right lower lobe collapse/mucus plugging Septic shock secondary to above -Intubated with bronchoscopy 1/5; BAL cultures negative to date -reintubated after self extubation on 1/6 P: Mentation continues to preclude extubation as patient becomes severely agitated with lightening of any sedation Continue ventilator support with lung protective strategies  Wean PEEP and FiO2 for sats greater than 90%. Head of bed elevated 30 degrees. Plateau pressures less than 30 cm H20.  Follow intermittent chest x-ray and ABG.   SAT/SBT as tolerated, mentation preclude extubation  Ensure adequate pulmonary hygiene  Follow cultures  VAP bundle in place  PAD protocol Remains on home Robinul  if secretions thicken consider holding Goals of care discussion  as below  Acute agitated delirium Cerebral palsy Cognitive delay  Spastic quadriparesis  Cortical blindness Hx anoxic brain injury Hx seizure disorder  P: Difficulty minimizing sedation given significant agitation  Continue home Tegretol  and Depakene  PAD protocol with RASS goal 0 to  -1 Continue Seroquel  200 mg twice daily and Klonopin  1 mg twice daily  Hyponatremia - stable -Off free water  given significant volume overload P: Continue scheduled Lasix   Urinary retention Latex allergy -Silicon Foley placed by urology 1/8 P: Continue Foley.  Acute agitated delirium Cerebral palsy Cognitive delay  Spastic quadriparesis  Cortical blindness Hx anoxic brain injury Hx seizure disorder  Plan: - cont home Tegretol  and depakene  - Precedex  and fentanyl  drips, goal RASS 0 to -1 - cont Seroquel  200, continue clonazepam  1 bid - on home Benadryl  12.5 twice daily  Goals of care discussion with dad 1/8 : He has home care , dad has seen a tracheostomy with his father who had dementia and did not do well.  He does not desire tracheostomy for Wayne Meyers .  He he certainly does not want facility care.  He understands that and his agitation has been difficult to control and this is a mandatory step before we can proceed with spontaneous breathing trial.  Is still hopeful for successful extubation but has realistic expectations.  -On 1/12 updated father at bedside, palliative care following plans for family meeting today at 2 PM  Critical care time:   CRITICAL CARE Performed by: Wayne Meyers   Total critical care time: 38 minutes  Critical care time was exclusive of separately billable procedures and treating other patients.  Critical care was necessary to treat or prevent imminent or life-threatening deterioration.  Critical care was time spent personally by me on the following activities: development of treatment plan with patient and/or surrogate as well as nursing, discussions with consultants, evaluation of patient's response to treatment, examination of patient, obtaining history from patient or surrogate, ordering and performing treatments and interventions, ordering and review of laboratory studies, ordering and review of radiographic studies, pulse oximetry and  re-evaluation of patient's condition.  Wayne Meyers D. Harris, NP-C [facility] Personal contact information can be found on Amion  If no contact or response made please call [phone] 08/20/2024, 11:57 AM       "

## 2024-08-20 NOTE — Plan of Care (Signed)

## 2024-08-21 DIAGNOSIS — A419 Sepsis, unspecified organism: Secondary | ICD-10-CM | POA: Diagnosis not present

## 2024-08-21 DIAGNOSIS — H47619 Cortical blindness, unspecified side of brain: Secondary | ICD-10-CM | POA: Diagnosis not present

## 2024-08-21 DIAGNOSIS — R4189 Other symptoms and signs involving cognitive functions and awareness: Secondary | ICD-10-CM

## 2024-08-21 DIAGNOSIS — G800 Spastic quadriplegic cerebral palsy: Secondary | ICD-10-CM | POA: Diagnosis not present

## 2024-08-21 DIAGNOSIS — Z8669 Personal history of other diseases of the nervous system and sense organs: Secondary | ICD-10-CM | POA: Diagnosis not present

## 2024-08-21 DIAGNOSIS — E871 Hypo-osmolality and hyponatremia: Secondary | ICD-10-CM | POA: Diagnosis not present

## 2024-08-21 DIAGNOSIS — R339 Retention of urine, unspecified: Secondary | ICD-10-CM | POA: Diagnosis not present

## 2024-08-21 DIAGNOSIS — J9819 Other pulmonary collapse: Secondary | ICD-10-CM | POA: Diagnosis not present

## 2024-08-21 DIAGNOSIS — J9601 Acute respiratory failure with hypoxia: Secondary | ICD-10-CM | POA: Diagnosis not present

## 2024-08-21 DIAGNOSIS — R6521 Severe sepsis with septic shock: Secondary | ICD-10-CM | POA: Diagnosis not present

## 2024-08-21 DIAGNOSIS — J101 Influenza due to other identified influenza virus with other respiratory manifestations: Secondary | ICD-10-CM | POA: Diagnosis not present

## 2024-08-21 DIAGNOSIS — Z66 Do not resuscitate: Secondary | ICD-10-CM

## 2024-08-21 DIAGNOSIS — F05 Delirium due to known physiological condition: Secondary | ICD-10-CM | POA: Diagnosis not present

## 2024-08-21 LAB — GLUCOSE, CAPILLARY
Glucose-Capillary: 103 mg/dL — ABNORMAL HIGH (ref 70–99)
Glucose-Capillary: 106 mg/dL — ABNORMAL HIGH (ref 70–99)
Glucose-Capillary: 155 mg/dL — ABNORMAL HIGH (ref 70–99)
Glucose-Capillary: 162 mg/dL — ABNORMAL HIGH (ref 70–99)
Glucose-Capillary: 38 mg/dL — CL (ref 70–99)
Glucose-Capillary: 49 mg/dL — ABNORMAL LOW (ref 70–99)
Glucose-Capillary: 65 mg/dL — ABNORMAL LOW (ref 70–99)
Glucose-Capillary: 74 mg/dL (ref 70–99)
Glucose-Capillary: 79 mg/dL (ref 70–99)
Glucose-Capillary: 88 mg/dL (ref 70–99)
Glucose-Capillary: 95 mg/dL (ref 70–99)

## 2024-08-21 LAB — BASIC METABOLIC PANEL WITH GFR
Anion gap: 10 (ref 5–15)
BUN: 6 mg/dL (ref 6–20)
CO2: 32 mmol/L (ref 22–32)
Calcium: 8.8 mg/dL — ABNORMAL LOW (ref 8.9–10.3)
Chloride: 92 mmol/L — ABNORMAL LOW (ref 98–111)
Creatinine, Ser: 0.31 mg/dL — ABNORMAL LOW (ref 0.61–1.24)
GFR, Estimated: >60 mL/min
Glucose, Bld: 97 mg/dL (ref 70–99)
Potassium: 2.9 mmol/L — ABNORMAL LOW (ref 3.5–5.1)
Sodium: 134 mmol/L — ABNORMAL LOW (ref 135–145)

## 2024-08-21 LAB — CBC
HCT: 32 % — ABNORMAL LOW (ref 39.0–52.0)
Hemoglobin: 10.6 g/dL — ABNORMAL LOW (ref 13.0–17.0)
MCH: 29.9 pg (ref 26.0–34.0)
MCHC: 33.1 g/dL (ref 30.0–36.0)
MCV: 90.4 fL (ref 80.0–100.0)
Platelets: 308 K/uL (ref 150–400)
RBC: 3.54 MIL/uL — ABNORMAL LOW (ref 4.22–5.81)
RDW: 14 % (ref 11.5–15.5)
WBC: 14.5 K/uL — ABNORMAL HIGH (ref 4.0–10.5)
nRBC: 0.8 % — ABNORMAL HIGH (ref 0.0–0.2)

## 2024-08-21 LAB — TRIGLYCERIDES: Triglycerides: 187 mg/dL — ABNORMAL HIGH

## 2024-08-21 MED ORDER — DEXTROSE 50 % IV SOLN
INTRAVENOUS | Status: AC
  Administered 2024-08-21: 25 g via INTRAVENOUS
  Filled 2024-08-21: qty 50

## 2024-08-21 MED ORDER — DEXTROSE 50 % IV SOLN: 25.0000 g | INTRAVENOUS | Status: AC

## 2024-08-21 MED ORDER — POTASSIUM CHLORIDE 10 MEQ/100ML IV SOLN
10.0000 meq | INTRAVENOUS | Status: AC
  Administered 2024-08-21 (×4): 10 meq via INTRAVENOUS
  Filled 2024-08-21 (×4): qty 100

## 2024-08-21 MED ORDER — VALPROATE SODIUM 100 MG/ML IV SOLN
750.0000 mg | Freq: Three times a day (TID) | INTRAVENOUS | Status: DC
  Administered 2024-08-21 – 2024-08-23 (×6): 750 mg via INTRAVENOUS
  Filled 2024-08-21 (×9): qty 7.5

## 2024-08-21 MED ORDER — DIPHENHYDRAMINE HCL 50 MG/ML IJ SOLN
12.5000 mg | Freq: Two times a day (BID) | INTRAMUSCULAR | Status: DC
  Administered 2024-08-22 – 2024-08-23 (×2): 12.5 mg via INTRAVENOUS
  Filled 2024-08-21 (×3): qty 1

## 2024-08-21 MED ORDER — DEXTROSE-SODIUM CHLORIDE 5-0.45 % IV SOLN: INTRAVENOUS | Status: AC

## 2024-08-21 MED ORDER — MINERAL OIL RE ENEM
1.0000 | ENEMA | Freq: Once | RECTAL | Status: AC
  Administered 2024-08-21: 1 via RECTAL
  Filled 2024-08-21: qty 1

## 2024-08-21 MED ORDER — POTASSIUM CHLORIDE 20 MEQ PO PACK
40.0000 meq | PACK | Freq: Once | ORAL | Status: AC
  Administered 2024-08-21: 40 meq
  Filled 2024-08-21: qty 2

## 2024-08-21 MED ORDER — GLYCOPYRROLATE 0.2 MG/ML IJ SOLN
0.1000 mg | Freq: Four times a day (QID) | INTRAMUSCULAR | Status: DC
  Administered 2024-08-21 – 2024-08-23 (×8): 0.1 mg via INTRAVENOUS
  Filled 2024-08-21 (×8): qty 1

## 2024-08-21 MED ORDER — DEXTROSE 50 % IV SOLN
12.5000 g | INTRAVENOUS | Status: AC
  Administered 2024-08-21: 12.5 g via INTRAVENOUS
  Filled 2024-08-21: qty 50

## 2024-08-21 NOTE — Plan of Care (Signed)
" °  Problem: Health Behavior/Discharge Planning: Goal: Ability to manage health-related needs will improve Outcome: Progressing   Problem: Nutrition: Goal: Adequate nutrition will be maintained Outcome: Progressing   Problem: Pain Managment: Goal: General experience of comfort will improve and/or be controlled Outcome: Progressing   Problem: Safety: Goal: Ability to remain free from injury will improve Outcome: Progressing   Problem: Education: Goal: Knowledge of General Education information will improve Description: Including pain rating scale, medication(s)/side effects and non-pharmacologic comfort measures Outcome: Not Progressing   Problem: Activity: Goal: Risk for activity intolerance will decrease Outcome: Not Progressing   Problem: Coping: Goal: Level of anxiety will decrease Outcome: Not Progressing   "

## 2024-08-21 NOTE — Progress Notes (Signed)
 "  NAME:  Wayne Meyers, MRN:  [id number], DOB:  06/02/1991, LOS: 8 ADMISSION DATE:  08/13/2024, CONSULTATION DATE:  08/13/24 REFERRING MD:  Wayne Meyers , CHIEF COMPLAINT:  SOB   History of Present Illness:  34 yo M PMH CP, spastic quadriparesis, cortical blindness, anoxic brain injury, sz disorder, dysphagia G tube in situ who presented to [facility] ED 08/13/24 at referral of PCP for eval of SOB + hypoxia + Flu A positive.  CXR in ED w elevated R hemidiaphragm. CT chest with debris in airway, R mucus plugging. On levaquin , tamiflu . Due to worsening respiratory status patient intubated after admission.  Pertinent  Medical History  CP Spastic quadriplegia Anoxic brain injury  Cortical blindness  Sz disorder G tube in situ  Significant Hospital Events: Including procedures, antibiotic start and stop dates in addition to other pertinent events   08/13/24 admitted to [facility] with PCCM consult. Patient intubated on evaluation and switched to PCCM service 1/6: patient re-intubated after ET tube dislodged  1/7 breakthrough agitation >> Seroquel  and clonazepam  added 1/9 Patient then had audible cuff leak. Sats stable. Required ETT to be readvanced and added back sedation. 1/12 ongoing issues with agitation despite high-dose and Precedex  drips.  Patient is thrashing head back-and-forth resulting in dislodgment of ET tube  Interim History / Subjective:  Intermittent episodes of agitation on ventilator  Goals of care discussion with family at 2 PM  Objective    Blood pressure (!) 97/52, pulse 74, temperature (!) 97 F (36.1 C), temperature source Axillary, resp. rate (!) 22, height 5' 2 (1.575 m), weight 70.4 kg, SpO2 98%.    Vent Mode: PRVC FiO2 (%):  [30 %] 30 % Set Rate:  [22 bmp] 22 bmp Vt Set:  [340 mL] 340 mL PEEP:  [5 cmH20] 5 cmH20 Plateau Pressure:  [30 cmH20-34 cmH20] 31 cmH20   Intake/Output Summary (Last 24 hours) at 08/21/2024 1043 Last data filed at 08/21/2024 1016 Gross per 24 hour   Intake 2622.09 ml  Output 2950 ml  Net -327.91 ml   Filed Weights   08/19/24 0500 08/20/24 0500 08/21/24 0500  Weight: 71.6 kg 71.6 kg 70.4 kg    Examination: General: acute on chronic adult male with CP, on Vent, in NAD  HEENT: Normocephalic, PERRLA intact, ETT, OG, Pink MM CV: s1,s2, RRR, no MRG, No JVD  pulm: clear, diminished, no distress on vent Abs: bs active, soft  Extremities: generalized edema, contractures left/right Upper extremities  Skin: no rash  Neuro: Rass -3, responds to painful stimuli, sedated on prop  GU: foley    Resolved problem list  Hypoglycemia  Assessment and Plan  Goals of care discussion with dad 1/8 : He has home care , dad has seen a tracheostomy with his father who had dementia and did not do well.  He does not desire tracheostomy for Wayne Meyers .  He he certainly does not want facility care.  He understands that and his agitation has been difficult to control and this is a mandatory step before we can proceed with spontaneous breathing trial.  Is still hopeful for successful extubation but has realistic expectations.  -On 1/12 updated father at bedside, palliative care following plans for family meeting today at 2 PM P: Per Family discussion and IPAL note on 1/12, plan will be compassionately extubate 1/14-1/15 of this week.  Patient changed to DNR as well  Will communicate with father later today.  Continue current care at this time, if patient worsens bore 1/14 will  need to transition to comfort sooner    Acute Hypoxic Respiratory Failure secondary to Influenza A and right lower lobe collapse/mucus plugging Septic shock secondary to above -Intubated with bronchoscopy 1/5; BAL cultures negative to date -reintubated after self extubation on 1/6 Acute agitated delirium Cerebral palsy Cognitive delay  Spastic quadriparesis  Cortical blindness Hx anoxic brain injury Hx seizure disorder  Hyponatremia - stable Urinary retention Latex  allergy Acute agitated delirium Cerebral palsy Cognitive delay  Spastic quadriparesis  Cortical blindness Hx anoxic brain injury Hx seizure disorder  Concern for Small Bowel Obstruction/Ileus- distended abdomen, has not had BM - will hold tube feeds for now, and focus on comfort measures - Would recommend, holding oral meds at this time due to concern of SBO, will discuss with pharmacy in switching medications to IV  Continue lasix   Continue to trend labs, electrolytes  Continue GOC    Critical care time: 45 mins    Wayne Meyers AGACNP-BC   [facility] 08/21/2024, 12:30 PM  Please see [email or web address] for pager details.  From 7A-7P if no response, please call [phone]. After hours, please call ELink [phone].       "

## 2024-08-21 NOTE — Progress Notes (Signed)
 eLink Physician-Brief Progress Note Patient Name: Wayne Meyers DOB: [birth year] MRN: [id number]   Date of Service  08/21/2024  HPI/Events of Note  Has not had a BM since the 4th. Has had all the PRNs and scheduled bowel regamen. Stomach is now taut and distended.   eICU Interventions  Mineral oil enema     Intervention Category Minor Interventions: Routine modifications to care plan (e.g. PRN medications for pain, fever)  Ariyannah Pauling 08/21/2024, 3:28 AM

## 2024-08-21 NOTE — Progress Notes (Signed)
 [facility] ADULT ICU REPLACEMENT PROTOCOL   The patient does apply for the [facility] Adult ICU Electrolyte Replacment Protocol based on the criteria listed below:   1.Exclusion criteria: TCTS, ECMO, Dialysis, and Myasthenia Gravis patients 2. Is GFR >/= 30 ml/min? Yes.    Patient's GFR today is >60 3. Is SCr </= 2? Yes.   Patient's SCr is 0.31 mg/dL 4. Did SCr increase >/= 0.5 in 24 hours? No. 5.Pt's weight >40kg  Yes.   6. Abnormal electrolyte(s): K  7. Electrolytes replaced per protocol 8.  Call MD STAT for K+ </= 2.5, Phos </= 1, or Mag </= 1 Physician:  Haze Hunter BRAVO Jaena Brocato 08/21/2024 6:32 AM

## 2024-08-21 NOTE — Progress Notes (Signed)
 eLink Physician-Brief Progress Note Patient Name: Wayne Meyers DOB: 16-May-1991 MRN: [id number]   Date of Service  08/21/2024  HPI/Events of Note  CBG downtrending despite D5 half NS  eICU Interventions  Increased rate of fluids     Intervention Category Intermediate Interventions: Hyperglycemia - evaluation and treatment  Ellery Tash 08/21/2024, 10:40 PM

## 2024-08-21 NOTE — Progress Notes (Signed)
 "                                                                                                                                                                                                          Daily Progress Note   Patient Name: Wayne Meyers       Date: 08/21/2024 DOB: August 28, 1990  Age: 34 y.o. MRN#: [id number] Attending Physician: Mannam, Praveen, MD Primary Care Physician: Hunter Mickey Browner, PA-C Admit Date: 08/13/2024  Reason for Follow-up: Establishing goals of care  Patient Profile/HPI: 34 y.o. male  with past medical history of CP, spastic quadriparesis, cortical blindness, anoxic brain injury, seizure disorder, and dysphagia w/ G tube admitted on 08/13/2024 at referral of PCP for evaluation of shortness of breath, hypoxia, and Flu A.   CXR in ED w elevated R hemidiaphragm. CT chest with debris in airway, R mucus plugging. On levaquin , tamiflu . Due to worsening respiratory status patient intubated after admission. Self-extubated on 1/6 and was reintubated. Admission compl  Patient has been ventilator dependent. On 1/12 decision made to extubate to comfort after family has had time to visit.   Discussion: Chart reviewed.  Discussed with Sherlean Sharps, NP with critical care- patient likely has bowel obstruction- not pursuing further workup due to plan for extubation to comfort likely tomorrow. Tube feedings stopped. He is currently on propofol .  On eval- he appears sedated, comfortable. No family at bedside.   Review of Systems  Unable to perform ROS: Intubated     Physical Exam Vitals and nursing note reviewed.  Cardiovascular:     Rate and Rhythm: Normal rate.  Pulmonary:     Comments: intubated Musculoskeletal:     Comments: Upper extremity contractions, diffuse anasarca             Vital Signs: BP (!) 96/58   Pulse 88   Temp (!) 97 F (36.1 C) (Axillary)   Resp (!) 22   Ht 5' 2 (1.575 m) Comment: per father  Wt 70.4 kg   SpO2 97%   BMI 28.39 kg/m   SpO2: SpO2: 97 % O2 Device: O2 Device: Ventilator O2 Flow Rate: O2 Flow Rate (L/min): 2 L/min  Intake/output summary:  Intake/Output Summary (Last 24 hours) at 08/21/2024 1234 Last data filed at 08/21/2024 1112 Gross per 24 hour  Intake 2781.93 ml  Output 2950 ml  Net -168.07 ml   LBM: Last BM Date :  (1/4) Baseline Weight: Weight: 62.1 kg Most recent weight: Weight: 70.4 kg       Palliative Assessment/Data: PPS: 10%  Patient Active Problem List   Diagnosis Date Noted   Mucus plugging of bronchi 08/14/2024   Cerebral palsy (HCC) 08/14/2024   Acute respiratory failure with hypoxia (HCC) 08/13/2024   Influenza A 08/13/2024   Atelectasis 08/13/2024   Insomnia 11/27/2020   Constipation 11/27/2020   Drooling 11/27/2020   S/P percutaneous endoscopic gastrostomy (PEG) tube placement (HCC) 05/18/2020   CP (cerebral palsy), congenital (HCC) 05/18/2016   Cerebral palsy, quadriplegic (HCC) 05/18/2016   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   Myoclonus 04/11/2013   Congenital quadriplegia (HCC) 04/11/2013   Dysphagia, oropharyngeal phase 04/11/2013   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  I personally spent a total of 45 minutes in the care of the patient today including preparing to see the patient,  getting/reviewing separately obtained history, performing a medically appropriate exam/evaluation, and referring and communicating with other health care professionals.   Cassondra Stain, AGNP-C Palliative Medicine   Please contact Palliative Medicine Team phone at [phone] for questions and concerns.        "

## 2024-08-21 NOTE — Progress Notes (Signed)
 RN was reporting that patient had vent alarming with low minute volume.  Went in and heard patient making a snoring sound, cuff was losing pressure so advanced tube to 22 @ lip, snoring sound stopped and minute volume went up to 7.5.  RN informed.

## 2024-08-22 DIAGNOSIS — R6521 Severe sepsis with septic shock: Secondary | ICD-10-CM | POA: Diagnosis not present

## 2024-08-22 DIAGNOSIS — J9601 Acute respiratory failure with hypoxia: Secondary | ICD-10-CM | POA: Diagnosis not present

## 2024-08-22 DIAGNOSIS — A419 Sepsis, unspecified organism: Secondary | ICD-10-CM | POA: Diagnosis not present

## 2024-08-22 DIAGNOSIS — J101 Influenza due to other identified influenza virus with other respiratory manifestations: Secondary | ICD-10-CM | POA: Diagnosis not present

## 2024-08-22 LAB — CBC WITH DIFFERENTIAL/PLATELET
Abs Immature Granulocytes: 2.03 K/uL — ABNORMAL HIGH (ref 0.00–0.07)
Basophils Absolute: 0 K/uL (ref 0.0–0.1)
Basophils Relative: 0 %
Eosinophils Absolute: 0.2 K/uL (ref 0.0–0.5)
Eosinophils Relative: 1 %
HCT: 29 % — ABNORMAL LOW (ref 39.0–52.0)
Hemoglobin: 9.8 g/dL — ABNORMAL LOW (ref 13.0–17.0)
Immature Granulocytes: 16 %
Lymphocytes Relative: 19 %
Lymphs Abs: 2.4 K/uL (ref 0.7–4.0)
MCH: 30.3 pg (ref 26.0–34.0)
MCHC: 33.8 g/dL (ref 30.0–36.0)
MCV: 89.8 fL (ref 80.0–100.0)
Monocytes Absolute: 2.2 K/uL — ABNORMAL HIGH (ref 0.1–1.0)
Monocytes Relative: 17 %
Neutro Abs: 5.9 K/uL (ref 1.7–7.7)
Neutrophils Relative %: 47 %
Platelets: 314 K/uL (ref 150–400)
RBC: 3.23 MIL/uL — ABNORMAL LOW (ref 4.22–5.81)
RDW: 13.9 % (ref 11.5–15.5)
Smear Review: NORMAL
WBC: 12.7 K/uL — ABNORMAL HIGH (ref 4.0–10.5)
nRBC: 0.3 % — ABNORMAL HIGH (ref 0.0–0.2)

## 2024-08-22 LAB — RENAL FUNCTION PANEL
Albumin: 2.2 g/dL — ABNORMAL LOW (ref 3.5–5.0)
Anion gap: 7 (ref 5–15)
BUN: 6 mg/dL (ref 6–20)
CO2: 32 mmol/L (ref 22–32)
Calcium: 8.2 mg/dL — ABNORMAL LOW (ref 8.9–10.3)
Chloride: 94 mmol/L — ABNORMAL LOW (ref 98–111)
Creatinine, Ser: 0.32 mg/dL — ABNORMAL LOW (ref 0.61–1.24)
GFR, Estimated: >60 mL/min
Glucose, Bld: 88 mg/dL (ref 70–99)
Phosphorus: 3.9 mg/dL (ref 2.5–4.6)
Potassium: 3.1 mmol/L — ABNORMAL LOW (ref 3.5–5.1)
Sodium: 132 mmol/L — ABNORMAL LOW (ref 135–145)

## 2024-08-22 LAB — GLUCOSE, CAPILLARY
Glucose-Capillary: 88 mg/dL (ref 70–99)
Glucose-Capillary: 23 mg/dL — CL (ref 70–99)
Glucose-Capillary: 65 mg/dL — ABNORMAL LOW (ref 70–99)
Glucose-Capillary: 80 mg/dL (ref 70–99)
Glucose-Capillary: 86 mg/dL (ref 70–99)
Glucose-Capillary: 83 mg/dL (ref 70–99)
Glucose-Capillary: 97 mg/dL (ref 70–99)
Glucose-Capillary: 98 mg/dL (ref 70–99)
Glucose-Capillary: 95 mg/dL (ref 70–99)

## 2024-08-22 MED ORDER — DEXTROSE 50 % IV SOLN
INTRAVENOUS | Status: AC
  Administered 2024-08-22: 12.5 g via INTRAVENOUS
  Filled 2024-08-22: qty 50

## 2024-08-22 MED ORDER — DEXTROSE 50 % IV SOLN: 25.0000 g | INTRAVENOUS | Status: AC

## 2024-08-22 MED ORDER — DEXTROSE 50 % IV SOLN
INTRAVENOUS | Status: AC
  Administered 2024-08-22: 25 g via INTRAVENOUS
  Filled 2024-08-22: qty 50

## 2024-08-22 MED ORDER — DEXTROSE 50 % IV SOLN: 12.5000 g | INTRAVENOUS | Status: AC

## 2024-08-22 MED ORDER — DEXTROSE 10 % IV SOLN: INTRAVENOUS | Status: DC

## 2024-08-22 NOTE — Progress Notes (Addendum)
 eLink Physician-Brief Progress Note Patient Name: Wayne Meyers DOB: 1991-02-24 MRN: [id number]   Date of Service  08/22/2024  HPI/Events of Note  Pt is going comfort care tomorrow. Family has requested for pt to not be given any meds via tube. All meds scheduled to be given via tube are being held tonight.     eICU Interventions  Continue  care fas per family wish     Intervention Category Intermediate Interventions: Other:  Wayne Meyers 08/22/2024, 10:52 PM  0520 Camera: High alert. Briefly hypotensive, HR < 60. In synchrony with vent, PIP is < 24. MAP improving.  Sedation held for RASS -3  Eye opening.  - hold propofol  for now On 400 of fentanyl .  - HR low, so will get labs.

## 2024-08-22 NOTE — Progress Notes (Signed)
 "  NAME:  Wayne Meyers, MRN:  [id number], DOB:  [birth year], LOS: 9 ADMISSION DATE:  08/13/2024, CONSULTATION DATE:  08/13/24 REFERRING MD:  Arthea , CHIEF COMPLAINT:  SOB   History of Present Illness:  34 yo M PMH CP, spastic quadriparesis, cortical blindness, anoxic brain injury, sz disorder, dysphagia G tube in situ who presented to [facility] ED 08/13/24 at referral of PCP for eval of SOB + hypoxia + Flu A positive.  CXR in ED w elevated R hemidiaphragm. CT chest with debris in airway, R mucus plugging. On levaquin , tamiflu . Due to worsening respiratory status patient intubated after admission.  Pertinent  Medical History  CP Spastic quadriplegia Anoxic brain injury  Cortical blindness  Sz disorder G tube in situ  Significant Hospital Events: Including procedures, antibiotic start and stop dates in addition to other pertinent events   08/13/24 admitted to [facility] with PCCM consult. Patient intubated on evaluation and switched to PCCM service 1/6: patient re-intubated after ET tube dislodged  1/7 breakthrough agitation >> Seroquel  and clonazepam  added 1/9 Patient then had audible cuff leak. Sats stable. Required ETT to be readvanced and added back sedation. 1/12 ongoing issues with agitation despite high-dose and Precedex  drips.  Patient is thrashing head back-and-forth resulting in dislodgment of ET tube 1/14 Plan for comfort tomorrow per father's and family request on 08-30-24   Interim History / Subjective:  Intubated, sedated  Responds to pain   Hypoglycemic events overnight   Objective    Blood pressure 127/78, pulse 98, temperature (!) 97.4 F (36.3 C), temperature source Axillary, resp. rate (!) 22, height 5' 2 (1.575 m), weight 71.2 kg, SpO2 99%.    Vent Mode: PRVC FiO2 (%):  [30 %] 30 % Set Rate:  [22 bmp] 22 bmp Vt Set:  [340 mL] 340 mL PEEP:  [5 cmH20] 5 cmH20 Plateau Pressure:  [29 cmH20-31 cmH20] 31 cmH20   Intake/Output Summary (Last 24 hours) at 08/22/2024 1347 Last data  filed at 08/22/2024 1200 Gross per 24 hour  Intake 2587.82 ml  Output 1400 ml  Net 1187.82 ml   Filed Weights   08/20/24 0500 08/21/24 0500 08/22/24 0500  Weight: 71.6 kg 70.4 kg 71.2 kg    Examination: General: acute on chronic, CP, adult male, lying in icu bed on vent  HEENT: Normocephalic, PERRLA intact, ETT, Pink MM CV: s1,s2, RRR, no MRG, No JVD  pulm: clear, diminished, no distress on vent  Abs: bs distended, PEG tube in place- clamped  Extremities: generalized pitting edema, no deformity, moves all extremities on command  Skin: no rash  Neuro: Rass -2, responds to painful stimuli, cough gag reflex present  GU: foley intact    Resolved problem list  Hypoglycemia  Assessment and Plan  Goals of care discussion with dad 1/8 : He has home care , dad has seen a tracheostomy with his father who had dementia and did not do well.  He does not desire tracheostomy for Raesean .  He he certainly does not want facility care.  He understands that and his agitation has been difficult to control and this is a mandatory step before we can proceed with spontaneous breathing trial.  Is still hopeful for successful extubation but has realistic expectations.  -On 1/12 updated father at bedside, palliative care following plans for family meeting today at 2 PM P: Discussed with patient's father on 1/13- plan is to allow patient's mother visit today on 1/14  And plan to go comfort tomorrow on 2024-08-30.  DNR status. Will plan to compassionately extubate tomorrow on 2024/09/02.  Continue to hold tube feeds due to abdominal extension- father is aware, continue d10 infusion since patient hypoglycemic despite D5 infusion.  Continue scheduled lasix  for now  Continue propofol  for sedation and fentanyl  gtt  Continue to monitor CBGs q2, and then once stable can back to q4   Acute Hypoxic Respiratory Failure secondary to Influenza A and right lower lobe collapse/mucus plugging Septic shock secondary to  above -Intubated with bronchoscopy 1/5; BAL cultures negative to date -reintubated after self extubation on 1/6 Acute agitated delirium Cerebral palsy Cognitive delay  Spastic quadriparesis  Cortical blindness Hx anoxic brain injury Hx seizure disorder  Hyponatremia - stable Urinary retention Latex allergy Acute agitated delirium Cerebral palsy Cognitive delay  Spastic quadriparesis  Cortical blindness Hx anoxic brain injury Hx seizure disorder  Concern for Small Bowel Obstruction/Ileus- distended abdomen, has not had BM    Critical care time: 40 mins    [name] Shaine Newmark AGACNP-BC   [facility] 08/22/2024, 1:47 PM  Please see [email or web address] for pager details.  From 7A-7P if no response, please call [phone]. After hours, please call ELink [phone].       "

## 2024-08-22 NOTE — Plan of Care (Signed)
" °  Problem: Clinical Measurements: Goal: Respiratory complications will improve Outcome: Progressing Goal: Cardiovascular complication will be avoided Outcome: Progressing   Problem: Respiratory: Goal: Ability to maintain a clear airway and adequate ventilation will improve Outcome: Progressing   Problem: Activity: Goal: Risk for activity intolerance will decrease Outcome: Not Progressing   Problem: Nutrition: Goal: Adequate nutrition will be maintained Outcome: Not Progressing   Problem: Elimination: Goal: Will not experience complications related to bowel motility Outcome: Not Progressing Goal: Will not experience complications related to urinary retention Outcome: Not Progressing   Problem: Pain Managment: Goal: General experience of comfort will improve and/or be controlled Outcome: Not Progressing   "

## 2024-08-23 DIAGNOSIS — J101 Influenza due to other identified influenza virus with other respiratory manifestations: Secondary | ICD-10-CM | POA: Diagnosis not present

## 2024-08-23 DIAGNOSIS — E871 Hypo-osmolality and hyponatremia: Secondary | ICD-10-CM | POA: Diagnosis not present

## 2024-08-23 DIAGNOSIS — R339 Retention of urine, unspecified: Secondary | ICD-10-CM | POA: Diagnosis not present

## 2024-08-23 DIAGNOSIS — J9819 Other pulmonary collapse: Secondary | ICD-10-CM | POA: Diagnosis not present

## 2024-08-23 DIAGNOSIS — T7849XA Other allergy, initial encounter: Secondary | ICD-10-CM | POA: Diagnosis not present

## 2024-08-23 DIAGNOSIS — R6521 Severe sepsis with septic shock: Secondary | ICD-10-CM | POA: Diagnosis not present

## 2024-08-23 DIAGNOSIS — T17900A Unspecified foreign body in respiratory tract, part unspecified causing asphyxiation, initial encounter: Secondary | ICD-10-CM | POA: Diagnosis not present

## 2024-08-23 DIAGNOSIS — G800 Spastic quadriplegic cerebral palsy: Secondary | ICD-10-CM | POA: Diagnosis not present

## 2024-08-23 DIAGNOSIS — J9601 Acute respiratory failure with hypoxia: Secondary | ICD-10-CM | POA: Diagnosis not present

## 2024-08-23 DIAGNOSIS — A4189 Other specified sepsis: Secondary | ICD-10-CM | POA: Diagnosis not present

## 2024-08-23 DIAGNOSIS — F05 Delirium due to known physiological condition: Secondary | ICD-10-CM | POA: Diagnosis not present

## 2024-08-23 DIAGNOSIS — H47619 Cortical blindness, unspecified side of brain: Secondary | ICD-10-CM | POA: Diagnosis not present

## 2024-08-23 LAB — BASIC METABOLIC PANEL WITH GFR
Anion gap: 6 (ref 5–15)
BUN: <5 mg/dL — ABNORMAL LOW (ref 6–20)
CO2: 33 mmol/L — ABNORMAL HIGH (ref 22–32)
Calcium: 8.3 mg/dL — ABNORMAL LOW (ref 8.9–10.3)
Chloride: 96 mmol/L — ABNORMAL LOW (ref 98–111)
Creatinine, Ser: 0.31 mg/dL — ABNORMAL LOW (ref 0.61–1.24)
GFR, Estimated: >60 mL/min
Glucose, Bld: 112 mg/dL — ABNORMAL HIGH (ref 70–99)
Potassium: 2.8 mmol/L — ABNORMAL LOW (ref 3.5–5.1)
Sodium: 135 mmol/L (ref 135–145)

## 2024-08-23 LAB — GLUCOSE, CAPILLARY
Glucose-Capillary: 94 mg/dL (ref 70–99)
Glucose-Capillary: 79 mg/dL (ref 70–99)

## 2024-08-23 LAB — MAGNESIUM: Magnesium: 1.8 mg/dL (ref 1.7–2.4)

## 2024-08-23 LAB — PHOSPHORUS: Phosphorus: 4.1 mg/dL (ref 2.5–4.6)

## 2024-08-23 MED ORDER — GLYCOPYRROLATE 0.2 MG/ML IJ SOLN: 0.2000 mg | INTRAMUSCULAR | Status: DC | PRN

## 2024-08-23 MED ORDER — MIDAZOLAM HCL (PF) 2 MG/2ML IJ SOLN: 1.0000 mg | INTRAMUSCULAR | Status: DC | PRN

## 2024-08-23 MED ORDER — POLYVINYL ALCOHOL 1.4 % OP SOLN: 1.0000 [drp] | Freq: Four times a day (QID) | OPHTHALMIC | Status: DC | PRN

## 2024-09-09 NOTE — Progress Notes (Signed)
 Patient extubated for comfort care to RA at this time. Patient comfortable at this time. Family and RN at bedside.

## 2024-09-09 NOTE — Progress Notes (Signed)
 "  NAME:  Wayne Meyers, MRN:  [id number], DOB:  19-Mar-1991, LOS: 10 ADMISSION DATE:  08/13/2024, CONSULTATION DATE:  08/13/24 REFERRING MD:  Arthea , CHIEF COMPLAINT:  SOB   History of Present Illness:  34 yo M PMH CP, spastic quadriparesis, cortical blindness, anoxic brain injury, sz disorder, dysphagia G tube in situ who presented to [facility] ED 08/13/24 at referral of PCP for eval of SOB + hypoxia + Flu A positive.  CXR in ED w elevated R hemidiaphragm. CT chest with debris in airway, R mucus plugging. On levaquin , tamiflu . Due to worsening respiratory status patient intubated after admission.  Pertinent  Medical History  CP Spastic quadriplegia Anoxic brain injury  Cortical blindness  Sz disorder G tube in situ  Significant Hospital Events: Including procedures, antibiotic start and stop dates in addition to other pertinent events   08/13/24 admitted to [facility] with PCCM consult. Patient intubated on evaluation and switched to PCCM service 1/6: patient re-intubated after ET tube dislodged  1/7 breakthrough agitation >> Seroquel  and clonazepam  added 1/9 Patient then had audible cuff leak. Sats stable. Required ETT to be readvanced and added back sedation. 1/12 ongoing issues with agitation despite high-dose and Precedex  drips.  Patient is thrashing head back-and-forth resulting in dislodgment of ET tube 1/14 Plan for comfort tomorrow per father's and family request on September 18, 2024   Interim History / Subjective:  Remains on NE 4 Plans to transition to one way palliative extubation today  Objective    Blood pressure (!) 154/78, pulse 95, temperature 98.9 F (37.2 C), temperature source Axillary, resp. rate (!) 22, height 5' 2 (1.575 m), weight 78.1 kg, SpO2 100%.    Vent Mode: PRVC FiO2 (%):  [30 %] 30 % Set Rate:  [22 bmp] 22 bmp Vt Set:  [340 mL] 340 mL PEEP:  [5 cmH20] 5 cmH20 Plateau Pressure:  [26 cmH20-31 cmH20] 26 cmH20   Intake/Output Summary (Last 24 hours) at 2024/09/18 1127 Last  data filed at 09/18/2024 1100 Gross per 24 hour  Intake 3128.88 ml  Output 3675 ml  Net -546.12 ml   Filed Weights   08/21/24 0500 08/22/24 0500 09/18/2024 0500  Weight: 70.4 kg 71.2 kg 78.1 kg    Examination: Propofol  65, fent 375 General:  AoC appearing male in NAD HEENT: ETT Neuro: has occasionally smiled for father, no spont movement in extremities, occasionally some head turns CV: rr PULM:  MV supported, apneic on SBT GI: PEG, distended, foley  Extremities: warm/dry, generalized edema   Afebrile Labs> K 2.8, sCr 0.31 UOP 2.8L/ 24hrs Net +11.9   Resolved problem list  Hypoglycemia  Assessment and Plan   Acute Hypoxic Respiratory Failure secondary to Influenza A and right lower lobe collapse/mucus plugging Septic shock secondary to above Acute agitated delirium Cerebral palsy Cognitive delay  Spastic quadriparesis  Cortical blindness Hx anoxic brain injury Hx seizure disorder  Hyponatremia - stable Urinary retention Latex allergy Acute agitated delirium Cerebral palsy Cognitive delay  Spastic quadriparesis  Cortical blindness Hx anoxic brain injury Hx seizure disorder  Concern for Small Bowel Obstruction/Ileus- distended abdomen, has not had BM P:  - father has declined [employer] bridge this am.  Would like not to prolong son's suffering or pain and family ready to proceed with one way palliative extubation.  Getting IV VPA but has not been receiving enteral tegretol  meds, no meds per father request along with ongoing concerns or ileus vs SBO has been NPO.  Therefore will be extubated on propofol  and  continue fentanyl  for comfort as well as seizure precautions, prn robinul , versed , tylenol , zofran  as needed.   Pt apneic on SBT, no desaturations, suspect passing will be quick for this reason as well as discontinuing vasopressor support after extubation but family aware it could take hours, possibly days.  Keep foley for comfort.  Family declined hospital chaplin.   Family support ongoing.        CRITICAL CARE Performed by: Lyle Pesa   Total critical care time: 38 minutes  Critical care time was exclusive of separately billable procedures and treating other patients.  Critical care was necessary to treat or prevent imminent or life-threatening deterioration.  Critical care was time spent personally by me on the following activities: development of treatment plan with patient and/or surrogate as well as nursing, discussions with consultants, evaluation of patient's response to treatment, examination of patient, obtaining history from patient or surrogate, ordering and performing treatments and interventions, ordering and review of laboratory studies, ordering and review of radiographic studies, pulse oximetry and re-evaluation of patient's condition.    Lyle Pesa, NP [facility] September 08, 2024, 11:38 AM  See Amion for pager If no response to pager , please call [phone] until 7pm After 7:00 pm call Elink  [phone]     "

## 2024-09-09 NOTE — TOC Progression Note (Signed)
 Transition of Care [facility]) - Progression Note    Patient Details  Name: Wayne Meyers MRN: [id number] Date of Birth: 1990/09/07  [facility] CM/SW Contact  Jon ONEIDA Anon, RN Phone Number: 20-Sep-2024, 1:04 PM  Clinical Narrative:    Pt transitioning to comfort care measures at this time. No further ICM needs identified. ICM will sign off. Please place a consult if ICM needs should arise.      Expected Discharge Plan:  (TBD) Barriers to Discharge: Continued Medical Work up               Expected Discharge Plan and Services In-house Referral: NA Discharge Planning Services: CM Consult Post Acute Care Choice: Durable Medical Equipment Living arrangements for the past 2 months: Single Family Home                 DME Arranged: N/A DME Agency: NA                   Social Drivers of Health (SDOH) Interventions SDOH Screenings   Food Insecurity: No Food Insecurity (08/13/2024)  Housing: Low Risk (08/13/2024)  Transportation Needs: No Transportation Needs (08/13/2024)  Utilities: Not At Risk (08/13/2024)  Tobacco Use: Low Risk (08/13/2024)    Readmission Risk Interventions    08/15/2024    3:41 PM  Readmission Risk Prevention Plan  Post Dischage Appt Complete  Medication Screening Complete  Transportation Screening Complete

## 2024-09-09 NOTE — Progress Notes (Signed)
 Nutrition Brief Note  Chart reviewed. Pt now transitioning to comfort care.  No further nutrition interventions planned at this time.  Please re-consult as needed.   Shelle Iron RD, LDN Contact via [employer].

## 2024-09-09 NOTE — Plan of Care (Signed)
  Problem: Education: Goal: Knowledge of General Education information will improve Description: Including pain rating scale, medication(s)/side effects and non-pharmacologic comfort measures Outcome: Not Progressing   Problem: Health Behavior/Discharge Planning: Goal: Ability to manage health-related needs will improve Outcome: Not Progressing   Problem: Coping: Goal: Level of anxiety will decrease Outcome: Not Progressing   Problem: Elimination: Goal: Will not experience complications related to bowel motility Outcome: Not Progressing Goal: Will not experience complications related to urinary retention Outcome: Not Progressing

## 2024-09-09 DEATH — deceased

## 2024-10-07 NOTE — Death Summary Note (Signed)
" °  DEATH SUMMARY   Patient Details  Name: Wayne Meyers MRN: [id number] DOB: April 22, 1991  Admission/Discharge Information   Admit Date:  05-Sep-2024  Date of Death: Date of Death: September 15, 2024  Time of Death: Time of Death: 11-Oct-1211  Length of Stay: 10/11/2024  Referring Physician: Hunter Mickey Browner, PA-C   Reason(s) for Hospitalization  Flu a infection Acute hypoxic respiratory failure   Diagnoses  Preliminary cause of death:  Acute Hypoxic Respiratory Failure secondary to Influenza A and right lower lobe collapse/mucus plugging Septic shock secondary to above Acute agitated delirium Palliative care Comfort measures  Secondary Diagnoses (including complications and co-morbidities):  Cerebral palsy Cognitive delay  Spastic quadriparesis  Cortical blindness Hx anoxic brain injury Hx seizure disorder  Hyponatremia - stable Urinary retention Latex allergy Acute agitated delirium Cerebral palsy Cognitive delay  Spastic quadriparesis  Cortical blindness Hx anoxic brain injury Hx seizure disorder  Concern for Small Bowel Obstruction/Ileus- distended abdomen, has not had [facility]   Brief Hospital Course (including significant findings, care, treatment, and services provided and events leading to death)   34 yo M PMH CP, spastic quadriparesis, cortical blindness, anoxic brain injury, sz disorder, dysphagia G tube in situ who presented to [facility] ED 09-05-2024 at referral of PCP for eval of SOB + hypoxia + Flu A positive.  CXR in ED w elevated R hemidiaphragm. CT chest with debris in airway, R mucus plugging. Started on antibiotics, tamiflu . Due to worsening respiratory status patient intubated after admission.   On 1/6 ETT was dislodged and patient immediately became hypoxic and had to be reintubated emergently.  Through this admission he had ongoing issues with breakthrough agitation in spite of Precedex  drip, sedation.  He failed multiple attempts at weaning the ventilator.  After discussion with  father who clearly expressed that he would not want to be supported long-term and would not want tracheostomy the decision was made to transition to comfort measures.  He was terminally extubated on 09/15/2024 and passed away shortly afterwards.  Signature:   Tafari Humiston MD [facility] 09/14/2024, 5:17 PM   "

## 2024-11-27 ENCOUNTER — Ambulatory Visit (INDEPENDENT_AMBULATORY_CARE_PROVIDER_SITE_OTHER): Payer: MEDICAID | Admitting: Family
